# Patient Record
Sex: Female | Born: 1970 | Race: White | Hispanic: No | Marital: Married | Smoking: Never smoker
Health system: Southern US, Community
[De-identification: ages and names within clinical notes are randomized; demographics above are authoritative.]

## PROBLEM LIST (undated history)

## (undated) DIAGNOSIS — C50919 Malignant neoplasm of unspecified site of unspecified female breast: Secondary | ICD-10-CM

## (undated) DIAGNOSIS — B019 Varicella without complication: Secondary | ICD-10-CM

## (undated) DIAGNOSIS — I10 Essential (primary) hypertension: Secondary | ICD-10-CM

## (undated) DIAGNOSIS — O00109 Unspecified tubal pregnancy without intrauterine pregnancy: Secondary | ICD-10-CM

## (undated) HISTORY — DX: Unspecified tubal pregnancy without intrauterine pregnancy: O00.109

## (undated) HISTORY — DX: Essential (primary) hypertension: I10

## (undated) HISTORY — PX: ECTOPIC PREGNANCY SURGERY: SHX613

## (undated) HISTORY — DX: Varicella without complication: B01.9

## (undated) HISTORY — PX: TUBAL LIGATION: SHX77

---

## 2010-03-12 HISTORY — PX: BLADDER SUSPENSION: SHX72

## 2010-07-22 HISTORY — PX: LAPAROSCOPIC ASSISTED VAGINAL HYSTERECTOMY: SHX5398

## 2010-10-11 HISTORY — PX: MASTECTOMY: SHX3

## 2011-05-11 HISTORY — PX: BREAST ENHANCEMENT SURGERY: SHX7

## 2011-09-22 ENCOUNTER — Telehealth: Payer: Self-pay | Admitting: *Deleted

## 2011-09-22 NOTE — Telephone Encounter (Signed)
Left message for pt to return my call so I can schedule her.

## 2011-09-29 ENCOUNTER — Other Ambulatory Visit: Payer: Self-pay | Admitting: *Deleted

## 2011-09-29 DIAGNOSIS — Z853 Personal history of malignant neoplasm of breast: Secondary | ICD-10-CM

## 2011-10-05 ENCOUNTER — Encounter (HOSPITAL_COMMUNITY): Payer: Self-pay | Admitting: *Deleted

## 2011-10-05 ENCOUNTER — Emergency Department (HOSPITAL_COMMUNITY)
Admission: EM | Admit: 2011-10-05 | Discharge: 2011-10-05 | Disposition: A | Discharge status: Home / Self Care | Payer: BC Managed Care – PPO | Source: Home / Self Care | Attending: Emergency Medicine | Admitting: Emergency Medicine

## 2011-10-05 DIAGNOSIS — N72 Inflammatory disease of cervix uteri: Secondary | ICD-10-CM

## 2011-10-05 HISTORY — DX: Malignant neoplasm of unspecified site of unspecified female breast: C50.919

## 2011-10-05 LAB — POCT URINALYSIS DIP (DEVICE)
Ketones, ur: 40 mg/dL — AB
Protein, ur: 30 mg/dL — AB

## 2011-10-05 LAB — WET PREP, GENITAL
Clue Cells Wet Prep HPF POC: NONE SEEN
Trich, Wet Prep: NONE SEEN
Yeast Wet Prep HPF POC: NONE SEEN

## 2011-10-05 LAB — POCT PREGNANCY, URINE: Preg Test, Ur: NEGATIVE

## 2011-10-05 MED ORDER — AZITHROMYCIN 250 MG PO TABS
1000.0000 mg | ORAL_TABLET | Freq: Once | ORAL | Status: AC
  Administered 2011-10-05: 1000 mg via ORAL

## 2011-10-05 MED ORDER — AZITHROMYCIN 250 MG PO TABS
ORAL_TABLET | ORAL | Status: AC
  Filled 2011-10-05: qty 5

## 2011-10-05 MED ORDER — CEFIXIME 400 MG PO TABS
ORAL_TABLET | ORAL | Status: AC
  Filled 2011-10-05: qty 1

## 2011-10-05 MED ORDER — CEFIXIME 400 MG PO TABS
400.0000 mg | ORAL_TABLET | Freq: Every day | ORAL | Status: DC
  Administered 2011-10-05: 400 mg via ORAL

## 2011-10-05 NOTE — ED Provider Notes (Signed)
Medical screening examination/treatment/procedure(s) were performed by non-physician practitioner and as supervising physician I was immediately available for consultation/collaboration.  Leslee Home, M.D.   Reuben Likes, MD 10/05/11 2135

## 2011-10-05 NOTE — ED Provider Notes (Signed)
History     CSN: [id number]  Arrival date & time 10/05/11  1757   None     Chief Complaint  Patient presents with  . Vaginal Itching  . Sore Throat    (Consider location/radiation/quality/duration/timing/severity/associated sxs/prior treatment) Patient is a 41 y.o. female presenting with vaginal discharge. The history is provided by the patient. No language interpreter was used.  Vaginal Discharge This is a new problem. The problem occurs constantly. The problem has been gradually worsening. Pertinent negatives include no abdominal pain. Nothing aggravates the symptoms. Nothing relieves the symptoms. She has tried nothing for the symptoms.  Pt reports she may have an std risk.  Pt complains of a sore throat  Past Medical History  Diagnosis Date  . Breast cancer     Past Surgical History  Procedure Date  . Mastectomy   . Abdominal hysterectomy     Family History  Problem Relation Age of Onset  . Family history unknown: Yes    History  Substance Use Topics  . Smoking status: Never Smoker   . Smokeless tobacco: Not on file  . Alcohol Use: Yes     socially    OB History    Grav Para Term Preterm Abortions TAB SAB Ect Mult Living                  Review of Systems  Gastrointestinal: Negative for abdominal pain.  Genitourinary: Positive for vaginal discharge.  All other systems reviewed and are negative.    Allergies  Review of patient's allergies indicates no known allergies.  Home Medications  No current outpatient prescriptions on file.  BP 125/79  Pulse 64  Temp 98.4 F (36.9 C) (Oral)  Resp 18  SpO2 100%  Physical Exam  Nursing note and vitals reviewed. Constitutional: She is oriented to person, place, and time. She appears well-developed and well-nourished.  HENT:  Head: Normocephalic and atraumatic.  Right Ear: External ear normal.  Left Ear: External ear normal.       Erythematous throat,   Eyes: Conjunctivae and EOM are normal. Pupils  are equal, round, and reactive to light.  Neck: Normal range of motion. Neck supple.  Cardiovascular: Normal rate.   Pulmonary/Chest: Effort normal.  Abdominal: Soft.  Genitourinary: Vaginal discharge found.  Musculoskeletal: Normal range of motion.  Neurological: She is alert and oriented to person, place, and time. She has normal reflexes.  Skin: Skin is warm.    ED Course  Procedures (including critical care time)  Labs Reviewed  POCT URINALYSIS DIP (DEVICE) - Abnormal; Notable for the following:    Bilirubin Urine SMALL (*)     Ketones, ur 40 (*)     Hgb urine dipstick TRACE (*)     Protein, ur 30 (*)     Leukocytes, UA SMALL (*)  Biochemical Testing Only. Please order routine urinalysis from main lab if confirmatory testing is needed.   All other components within normal limits  POCT PREGNANCY, URINE   No results found.   1. Cervicitis       MDM  Pt request std treatment.  Pt given suprax and zithromax,         Lonia Skinner Page, [place] 10/05/11 2033

## 2011-10-05 NOTE — ED Notes (Signed)
Pt reports vaginal itching/irratation - yellow discharge. Also reports sore throat.

## 2011-10-06 LAB — GC/CHLAMYDIA PROBE AMP, GENITAL
Chlamydia, DNA Probe: NEGATIVE
GC Probe Amp, Genital: NEGATIVE

## 2011-10-13 ENCOUNTER — Encounter: Payer: Self-pay | Admitting: Oncology

## 2011-10-13 ENCOUNTER — Other Ambulatory Visit (HOSPITAL_BASED_OUTPATIENT_CLINIC_OR_DEPARTMENT_OTHER): Payer: BC Managed Care – PPO | Admitting: Lab

## 2011-10-13 ENCOUNTER — Ambulatory Visit: Payer: BC Managed Care – PPO

## 2011-10-13 ENCOUNTER — Ambulatory Visit (HOSPITAL_BASED_OUTPATIENT_CLINIC_OR_DEPARTMENT_OTHER): Payer: BC Managed Care – PPO | Admitting: Oncology

## 2011-10-13 VITALS — BP 140/95 | HR 82 | Temp 98.2°F | Resp 20 | Ht 65.2 in | Wt 179.6 lb

## 2011-10-13 DIAGNOSIS — Z853 Personal history of malignant neoplasm of breast: Secondary | ICD-10-CM

## 2011-10-13 DIAGNOSIS — C50919 Malignant neoplasm of unspecified site of unspecified female breast: Secondary | ICD-10-CM | POA: Insufficient documentation

## 2011-10-13 DIAGNOSIS — C50219 Malignant neoplasm of upper-inner quadrant of unspecified female breast: Secondary | ICD-10-CM

## 2011-10-13 LAB — CBC WITH DIFFERENTIAL/PLATELET
Basophils Absolute: 0.1 10*3/uL (ref 0.0–0.1)
EOS%: 1.7 % (ref 0.0–7.0)
LYMPH%: 30.7 % (ref 14.0–49.7)
MCH: 29.6 pg (ref 25.1–34.0)
MCV: 87.9 fL (ref 79.5–101.0)
MONO%: 6.7 % (ref 0.0–14.0)
Platelets: 258 10*3/uL (ref 145–400)
RBC: 4.62 10*6/uL (ref 3.70–5.45)
RDW: 13.8 % (ref 11.2–14.5)

## 2011-10-13 LAB — COMPREHENSIVE METABOLIC PANEL (CC13)
AST: 15 U/L (ref 5–34)
Albumin: 4 g/dL (ref 3.5–5.0)
Alkaline Phosphatase: 96 U/L (ref 40–150)
BUN: 13 mg/dL (ref 7.0–26.0)
Potassium: 3.9 mEq/L (ref 3.5–5.1)
Sodium: 143 mEq/L (ref 136–145)
Total Bilirubin: 0.4 mg/dL (ref 0.20–1.20)

## 2011-10-13 MED ORDER — TAMOXIFEN CITRATE 20 MG PO TABS: 20.0000 mg | ORAL_TABLET | Freq: Every day | ORAL | Status: AC

## 2011-10-13 NOTE — Progress Notes (Signed)
ID: Leslie Sullivan   DOB: [birth year]  MR#: [id number]  HYQ#:[id number]  PCP: No primary provider on file. GYN:  SU:  OTHER MD:   HISTORY OF PRESENT ILLNESS: The patient noted a mass in the right breast which appeared to change with her cycle. Mammography on 09/01/2010, however, at the [facility] in [place] showed a density in the medial aspect of the right breast, confirmed by additional views and ultrasonography, which showed a hypoechoic mass measuring 2.3 cm in the medial right breast. This was biopsied 09/12/2010, and showed (accession number [facility] [id number]) an invasive ductal carcinoma, grade 1, estrogen and progesterone receptor positive (both 3+), with an intermediate Ki-67, and an indeterminate HER-2 at 2+, further evaluated by [facility], which was negative.  The patient underwent bilateral breast MRI 09/19/2010. This confirmed the spiculated mass in the right breast upper inner aspect measuring 2.6 cm. Immediately anterior and medial to this was a small smooth nodule measuring 5 mm which was nonspecific. A third nodule in the lateral right breast measuring 1.7 mm was felt to be likely consistent with a fibroadenoma. In the left breast there were 2 foci of enhancement. 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 With this information and after appropriate discussion, the patient opted for bilateral mastectomies with right sentinel lymph node sampling. This was performed 10/24/2010, and showed ([id number]) on the right side, and invasive ductal carcinoma measuring 2.3 cm, grade 1, with one out of 9 lymph nodes sampled (2 of them being sentinel lymph nodes) positive. The left breast was benign. Her subsequent history is as detailed below.  INTERVAL HISTORY: French Ana moved to the [place] area recently and was seen in the breast clinic on  10/13/2011 to establish continuity of care.   REVIEW OF SYSTEMS: She tells me she did very well with her surgery and chemotherapy. A note from her oncologist an argon states that she had a radiation oncology consult in that the patient decided she did not want to do additional chemotherapy. The patient tells me that postmastectomy radiotherapy was not recommended. At any rate she was supposed to have started tamoxifen in February, but she "read up on it" and decided the side effects were not worth the decrease in risk (which she understood to be about 7%). Overall a detailed review of systems today was entirely negative.   PAST MEDICAL HISTORY: Past Medical History  Diagnosis Date  . Breast cancer   . Ectopic pregnancy, tubal     x2    PAST SURGICAL HISTORY: Past Surgical History  Procedure Date  . Mastectomy     bilateral  . Abdominal hysterectomy     no salpingo-oophorectomy    FAMILY HISTORY  (updated September 2013) No family history on file. The patient's parents are alive, both 41 years old. The patient has no brothers, 4 sisters. The patient's mother was diagnosed with cervical cancer at the age of 85. The patient's father was diagnosed with prostate cancer the age of 51. There is no history of breast or ovarian cancer in the immediate family.  GYNECOLOGIC HISTORY: Menarche age 41, first live birth age 41. She is GX P1. The patient underwent hysterectomy in may of 2012. There was no salpingo-oophorectomy. She never used hormone replacement. 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 The patient lives alone, with no pets.   ADVANCED DIRECTIVES: Not in place  HEALTH MAINTENANCE: History  Substance Use Topics  . Smoking status: Never Smoker   . Smokeless tobacco: Not on file  . Alcohol Use: Yes     socially      Colonoscopy:  PAP:  Bone density:  Lipid panel:  No Known Allergies  Current Outpatient Prescriptions  Medication Sig Dispense Refill  . MULTIPLE MINERALS PO Take by mouth. Pt takes zinc, fish oil, thyraxis,vitamin d and Immuncare      . MULTIPLE VITAMINS PO Take by mouth.      . tamoxifen (NOLVADEX) 20 MG tablet Take 1 tablet (20 mg total) by mouth daily.  90 tablet  12    OBJECTIVE: Young white woman who appears well Filed Vitals:   10/13/11 1613  BP: 140/95  Pulse: 82  Temp: 98.2 F (36.8 C)  Resp: 20     Body mass index is 29.70 kg/(m^2).    ECOG FS: 0  Sclerae unicteric Oropharynx clear No cervical or supraclavicular adenopathy Lungs no rales or rhonchi Heart regular rate and rhythm Abd benign MSK no focal spinal tenderness, no peripheral edema Neuro: nonfocal Breasts: Status post bilateral mastectomies with implant placement. There is no evidence of local recurrence on the right. Both axillae are clear.  LAB RESULTS: Lab Results  Component Value Date   WBC 4.9 10/13/2011      Chemistry      Component Value Date/Time   NA 143 10/13/2011 1550      Component Value Date/Time   CALCIUM 9.6 10/13/2011 1550       No results found for this basename: LABCA2    No components found with this basename: [id number]    No results found for this basename: INR:1;PROTIME:1 in the last 168 hours  Urinalysis    Component Value Date/Time   LABSPEC 1.025 10/05/2011 1934   PHURINE 6.0 10/05/2011 1934   GLUCOSEU NEGATIVE 10/05/2011 1934   HGBUR TRACE* 10/05/2011 1934   BILIRUBINUR SMALL* 10/05/2011 1934   KETONESUR 40* 10/05/2011 1934   PROTEINUR 30* 10/05/2011 1934   UROBILINOGEN 0.2 10/05/2011 1934   NITRITE NEGATIVE 10/05/2011 1934   LEUKOCYTESUR SMALL* 10/05/2011 1934    STUDIES: No results found. Outside reports reviewed.  ASSESSMENT: 41 y.o. BRCA negative [place] woman  (1) s/p bilateral mastectomies and Right sentinel lymph node sampling 10/24/2010 (with a  total of 9 axillary nodes removed) for a Right-sided T2 N1, stage IIB invasive ductal carcinoma, grade 1, strongly estrogen and progesterone receptor positive, HER-2 negative by FISH, with an intermediate Ki-67  (2) s/p cyclophosphamide/ docetaxel x6 completed January 2013  (3) no adjuvant radiation therapy  (4) supposed to be on tamoxifen as of February 2013, but has not yet started anti-estrogen therapy  PLAN: We went over her situation in detail. Initially I thought she had only had 6 lymph nodes removed, but reading over the pathology report with 9 lymph nodes removed total I think axillary exploration is adequate. We would have urged of postmastectomy radiation on the right here in [place], but this is an area that is still controversial. By contrast a recommendation to start tamoxifen is standard of care.  We went over her wrist situation in detail. The adjuvant! Database would quote her a 20% risk of dying from breast cancer within the next 10 years if her only adjuvant treatment is chemotherapy. This decreases to  about 14% he she takes anti-estrogens. Optimal antiestrogen therapy can cut residual risk in half, which means she would have a 7% decrease in mortality at 10 years if she took tamoxifen and aromatase inhibitors in some combination.  We then went over the possible toxicities, side effects and complications of tamoxifen. I gave her this information in writing. At this point she has not yet decided to start tamoxifen, but is considering it, and did request a prescription so she would not have to call in case she decides to take it. I have made her a return appointment in 3 months out to see how she is tolerating it. At that point we will decide whether to continue every 3, every 4, or every 6 month followup. She understands it would be helpful if she exercise 45 minutes 5 times a week. Otherwise she knows to call for any problems that may develop before the next  visit.   Kacyn Souder C    10/13/2011

## 2011-10-13 NOTE — Progress Notes (Signed)
Checked in new pt.  No financial concerns at this time. °

## 2011-10-14 ENCOUNTER — Telehealth: Payer: Self-pay | Admitting: *Deleted

## 2011-10-14 ENCOUNTER — Encounter: Payer: Self-pay | Admitting: *Deleted

## 2011-10-14 NOTE — Telephone Encounter (Signed)
Gave patient appointment for Leslie Sullivan on 10-29-2011 arrival time 8:45am left voice message to inform the patient of the appointment for lab and md

## 2011-10-14 NOTE — Progress Notes (Signed)
Mailed after appt letter to pt. 

## 2012-01-13 ENCOUNTER — Ambulatory Visit: Payer: BC Managed Care – PPO | Admitting: Oncology

## 2012-01-13 ENCOUNTER — Other Ambulatory Visit: Payer: BC Managed Care – PPO | Admitting: Lab

## 2012-02-09 ENCOUNTER — Telehealth: Payer: Self-pay | Admitting: *Deleted

## 2012-02-09 NOTE — Telephone Encounter (Signed)
per dawn rescheduled patient to 02-23-2012 left voice message to inform the patient of the new date and time

## 2012-02-16 ENCOUNTER — Ambulatory Visit: Payer: BC Managed Care – PPO | Admitting: Oncology

## 2012-02-16 ENCOUNTER — Other Ambulatory Visit: Payer: BC Managed Care – PPO | Admitting: Lab

## 2012-02-22 ENCOUNTER — Other Ambulatory Visit: Payer: Self-pay | Admitting: *Deleted

## 2012-02-23 ENCOUNTER — Ambulatory Visit: Payer: BC Managed Care – PPO | Admitting: Oncology

## 2012-02-23 ENCOUNTER — Other Ambulatory Visit: Payer: BC Managed Care – PPO | Admitting: Lab

## 2012-02-24 ENCOUNTER — Telehealth: Payer: Self-pay | Admitting: Oncology

## 2012-02-24 NOTE — Telephone Encounter (Signed)
Letter sent to patient from Dr. Magrinat. °

## 2012-05-30 ENCOUNTER — Other Ambulatory Visit: Payer: Self-pay | Admitting: *Deleted

## 2012-05-30 ENCOUNTER — Telehealth: Payer: Self-pay | Admitting: Oncology

## 2012-05-30 NOTE — Telephone Encounter (Signed)
S/w pt re appt for 4/24.

## 2012-06-02 ENCOUNTER — Other Ambulatory Visit: Payer: Self-pay | Admitting: *Deleted

## 2012-06-02 ENCOUNTER — Ambulatory Visit (HOSPITAL_BASED_OUTPATIENT_CLINIC_OR_DEPARTMENT_OTHER): Payer: BC Managed Care – PPO | Admitting: Oncology

## 2012-06-02 ENCOUNTER — Other Ambulatory Visit (HOSPITAL_BASED_OUTPATIENT_CLINIC_OR_DEPARTMENT_OTHER): Payer: BC Managed Care – PPO | Admitting: Lab

## 2012-06-02 ENCOUNTER — Telehealth: Payer: Self-pay | Admitting: *Deleted

## 2012-06-02 VITALS — BP 127/82 | HR 66 | Temp 98.5°F | Resp 20 | Ht 65.0 in | Wt 204.9 lb

## 2012-06-02 DIAGNOSIS — Z853 Personal history of malignant neoplasm of breast: Secondary | ICD-10-CM

## 2012-06-02 DIAGNOSIS — Z170 Estrogen receptor positive status [ER+]: Secondary | ICD-10-CM

## 2012-06-02 DIAGNOSIS — C50219 Malignant neoplasm of upper-inner quadrant of unspecified female breast: Secondary | ICD-10-CM

## 2012-06-02 DIAGNOSIS — C50911 Malignant neoplasm of unspecified site of right female breast: Secondary | ICD-10-CM

## 2012-06-02 LAB — COMPREHENSIVE METABOLIC PANEL (CC13)
AST: 15 U/L (ref 5–34)
Albumin: 3.9 g/dL (ref 3.5–5.0)
Alkaline Phosphatase: 73 U/L (ref 40–150)
BUN: 13.2 mg/dL (ref 7.0–26.0)
Creatinine: 0.8 mg/dL (ref 0.6–1.1)
Glucose: 101 mg/dl — ABNORMAL HIGH (ref 70–99)
Potassium: 4.7 mEq/L (ref 3.5–5.1)
Total Bilirubin: 0.44 mg/dL (ref 0.20–1.20)

## 2012-06-02 LAB — CBC WITH DIFFERENTIAL/PLATELET
Basophils Absolute: 0.1 10*3/uL (ref 0.0–0.1)
EOS%: 2.1 % (ref 0.0–7.0)
Eosinophils Absolute: 0.1 10*3/uL (ref 0.0–0.5)
HGB: 14.2 g/dL (ref 11.6–15.9)
LYMPH%: 26.5 % (ref 14.0–49.7)
MCH: 29.3 pg (ref 25.1–34.0)
MCV: 88.7 fL (ref 79.5–101.0)
MONO%: 6.5 % (ref 0.0–14.0)
NEUT#: 3.4 10*3/uL (ref 1.5–6.5)
NEUT%: 63.9 % (ref 38.4–76.8)
Platelets: 210 10*3/uL (ref 145–400)
RDW: 13.2 % (ref 11.2–14.5)

## 2012-06-02 NOTE — Progress Notes (Signed)
ID: Leslie Sullivan   DOB: 1970-09-14  MR#: [id number]  WUX#:[id number]  PCP: Pcp Not In System GYN:  SU:  OTHER MD:   HISTORY OF PRESENT ILLNESS: The patient noted a mass in the right breast which appeared to change with her cycle. Mammography on 09/01/2010, however, at the [facility] in [place] showed a density in the medial aspect of the right breast, confirmed by additional views and ultrasonography, which showed a hypoechoic mass measuring 2.3 cm in the medial right breast. This was biopsied 09/12/2010, and showed (accession number [facility] [id number]) an invasive ductal carcinoma, grade 1, estrogen and progesterone receptor positive (both 3+), with an intermediate Ki-67, and an indeterminate HER-2 at 2+, further evaluated by [facility], which was negative.  The patient underwent bilateral breast MRI 09/19/2010. This confirmed the spiculated mass in the right breast upper inner aspect measuring 2.6 cm. Immediately anterior and medial to this was a small smooth nodule measuring 5 mm which was nonspecific. A third nodule in the lateral right breast measuring 1.7 mm was felt to be likely consistent with a fibroadenoma. In the left breast there were 2 foci of enhancement. 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 With this information and after appropriate discussion, the patient opted for bilateral mastectomies with right sentinel lymph node sampling. This was performed 10/24/2010, and showed ([id number]) on the right side, and invasive ductal carcinoma measuring 2.3 cm, grade 1, with one out of 9 lymph nodes sampled (2 of them being sentinel lymph nodes) positive. The left breast was benign. Her subsequent history is as detailed below.  INTERVAL HISTORY: Leslie Sullivan returns today for followup of her breast cancer. She continues to think about antiestrogen  therapy but has fairly much decided not to take tamoxifen or similar drugs.   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 As a result she has some nausea and sometimes even vomiting a couple of times a month. She is not exercising regularly because she is so busy. She is having some loose bowel movements, but no black or tarry bowel movements and no blood in her bowel movements. She has always had irregular bowel movements, but these are little bit looser than usual for her. She describes herself is moderately fatigued and is waking up at therefore in the morning his thinking about work. She can have palpitations at the same time. She is having more hot flashes. Otherwise a detailed review of systems today was stable  PAST MEDICAL HISTORY: Past Medical History  Diagnosis Date  . Breast cancer   . Ectopic pregnancy, tubal     x2    PAST SURGICAL HISTORY: Past Surgical History  Procedure Laterality Date  . Mastectomy      bilateral  . Abdominal hysterectomy      no salpingo-oophorectomy    FAMILY HISTORY  (updated September 2013) No family history on file. The patient's parents are alive, both 27 years old. The patient has no brothers, 4 sisters. The patient's mother was diagnosed with cervical cancer at the age of 67. The patient's father was diagnosed with prostate cancer the age of 96. There is no history of breast or ovarian cancer in the immediate family.  GYNECOLOGIC HISTORY: Menarche age 48, first live birth age  22. She is GX P1. The patient underwent hysterectomy in may of 2012. There was no salpingo-oophorectomy. She never used hormone replacement. 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 The patient lives alone, with no  pets.   ADVANCED DIRECTIVES: Not in place  HEALTH MAINTENANCE: History  Substance Use Topics  . Smoking status: Never Smoker   . Smokeless tobacco: Not on file  . Alcohol Use: Yes     Comment: socially     Colonoscopy:  PAP:  Bone density:  Lipid panel:  No Known Allergies  Current Outpatient Prescriptions  Medication Sig Dispense Refill  . MULTIPLE MINERALS PO Take by mouth. Pt takes zinc, fish oil, thyraxis,vitamin d and Immuncare      . MULTIPLE VITAMINS PO Take by mouth.       No current facility-administered medications for this visit.    OBJECTIVE: Young white woman who appears well Filed Vitals:   06/02/12 1201  BP: 127/82  Pulse: 66  Temp: 98.5 F (36.9 C)  Resp: 20     Body mass index is 34.1 kg/(m^2).    ECOG FS: 0  Sclerae unicteric Oropharynx clear No cervical or supraclavicular adenopathy; mild thyromegaly Lungs no rales or rhonchi Heart regular rate and rhythm Abd benign MSK no focal spinal tenderness, no peripheral edema Neuro: nonfocal Breasts: Status post bilateral mastectomies with implant placement. Careful palpation of the right axilla, where she felt there might be something hard, shows nothing sips suspicious for recurrence. What I am feeling is muscle, rib, and the edge of the implant. Both axillae are clear.  LAB RESULTS: Lab Results  Component Value Date   WBC 5.3 06/02/2012      Chemistry      Component Value Date/Time   NA 143 10/13/2011 1550      Component Value Date/Time   CALCIUM 9.6 10/13/2011 1550       Lab Results  Component Value Date   LABCA2 9 10/13/2011    No components found with this basename: [id number]    No results found for this basename: INR,  in the last 168 hours  Urinalysis    Component Value Date/Time   LABSPEC 1.025 10/05/2011 1934   PHURINE 6.0 10/05/2011 1934   GLUCOSEU NEGATIVE 10/05/2011 1934   HGBUR TRACE* 10/05/2011 1934   BILIRUBINUR SMALL* 10/05/2011 1934   KETONESUR 40* 10/05/2011 1934    PROTEINUR 30* 10/05/2011 1934   UROBILINOGEN 0.2 10/05/2011 1934   NITRITE NEGATIVE 10/05/2011 1934   LEUKOCYTESUR SMALL* 10/05/2011 1934    STUDIES: No results found.   ASSESSMENT: 42 y.o. BRCA negative [place] woman  (1) s/p bilateral mastectomies and Right sentinel lymph node sampling 10/24/2010 (with a total of 9 axillary nodes removed) for a Right-sided T2 N1, stage IIB invasive ductal carcinoma, grade 1, strongly estrogen and progesterone receptor positive, HER-2 negative by FISH, with an intermediate Ki-67  (2) s/p cyclophosphamide/ docetaxel x6 completed January 2013  (3) no adjuvant radiation therapy  (4)  The adjuvant! database would quote her a 20% risk of dying from breast cancer within the next 10 years with local treatment (surgery) only. Chemotherapy decreases that risk to about 14%. Optimal antiestrogen therapy can cut residual risk in half, which means she would have a 7% decrease in mortality at 10 years if she took tamoxifen and aromatase inhibitors in some combination.  (  5) has decided against anti-estrogen therapy  PLAN: I think the right excellent is okay but we're going to obtain an ultrasound of that area just to make sure is double sure, since she has felt a change. I am also setting her up for ultrasound of the neck, to evaluate her thyroid, but we will not do that until October. We will obtain FSH, LH and estradiol also before the next visit.  I gave her a copy of the adjuvant prognostic data and also information in writing on tamoxifen and anastrozole. I don't want to continue to badger her by going over the same risk profile every time she comes in, but I do want her to have this data and now she has it hand and may review it as she sees fit. She has a good understanding that our recommendation is for her to take anti-estrogens for 5 years.  I have encouraged her to exercise on a regular basis as the best way to handle the work stress. She will return to see [place]  in 6 months she knows to call for any problems that may develop before that visit. Sukaina Toothaker C    06/02/2012

## 2012-06-02 NOTE — Telephone Encounter (Signed)
gv appt for 10.24.14, and for [facility]. Informed pt that cs will call to schedule her appt for her ultrasound...td

## 2012-11-01 ENCOUNTER — Telehealth: Payer: Self-pay | Admitting: *Deleted

## 2012-11-01 NOTE — Telephone Encounter (Signed)
LM informed the pt that i was returning her call....td

## 2012-11-16 ENCOUNTER — Ambulatory Visit (HOSPITAL_COMMUNITY): Payer: BC Managed Care – PPO

## 2012-11-23 ENCOUNTER — Telehealth: Payer: Self-pay | Admitting: Oncology

## 2012-11-23 NOTE — Telephone Encounter (Signed)
Pt called to cx October appts due to she no longer lives in [place].

## 2012-11-25 ENCOUNTER — Other Ambulatory Visit: Payer: BC Managed Care – PPO | Admitting: Lab

## 2012-12-02 ENCOUNTER — Ambulatory Visit: Payer: BC Managed Care – PPO | Admitting: Family

## 2012-12-02 ENCOUNTER — Other Ambulatory Visit: Payer: BC Managed Care – PPO | Admitting: Lab

## 2013-11-27 ENCOUNTER — Telehealth: Payer: Self-pay | Admitting: *Deleted

## 2013-11-27 NOTE — Telephone Encounter (Signed)
Former patient of Dr. Jana Hakim that moved out of area. Has moved back and would like follow-up. In basket sent to [facility].

## 2013-11-28 ENCOUNTER — Telehealth: Payer: Self-pay | Admitting: Oncology

## 2013-11-28 NOTE — Telephone Encounter (Signed)
per pof to sch pt appt-cld pt left a message-mailed copy of sch

## 2013-12-25 ENCOUNTER — Telehealth: Payer: Self-pay | Admitting: Oncology

## 2013-12-25 NOTE — Telephone Encounter (Signed)
per pof to sch pt appt-[facility] sch not open for May-adv will call-adv [facility] will call to sch-Kim to r/s appt-

## 2014-01-15 ENCOUNTER — Telehealth: Payer: Self-pay | Admitting: Oncology

## 2014-01-15 NOTE — Telephone Encounter (Signed)
pt cld left vm wanting to chge appt-cld pt left messaage to call back to r/s-adv [facility] booked upt o Feb-adv to call back to r/s a time & date

## 2014-01-31 ENCOUNTER — Other Ambulatory Visit: Payer: BC Managed Care – PPO

## 2014-01-31 ENCOUNTER — Ambulatory Visit: Payer: BC Managed Care – PPO | Admitting: Oncology

## 2014-02-13 ENCOUNTER — Other Ambulatory Visit: Payer: Self-pay | Admitting: *Deleted

## 2014-02-13 DIAGNOSIS — C50919 Malignant neoplasm of unspecified site of unspecified female breast: Secondary | ICD-10-CM

## 2014-02-14 ENCOUNTER — Other Ambulatory Visit (HOSPITAL_BASED_OUTPATIENT_CLINIC_OR_DEPARTMENT_OTHER): Payer: Managed Care, Other (non HMO)

## 2014-02-14 ENCOUNTER — Telehealth: Payer: Self-pay | Admitting: Oncology

## 2014-02-14 ENCOUNTER — Other Ambulatory Visit: Payer: Self-pay | Admitting: *Deleted

## 2014-02-14 ENCOUNTER — Ambulatory Visit (HOSPITAL_BASED_OUTPATIENT_CLINIC_OR_DEPARTMENT_OTHER): Payer: Managed Care, Other (non HMO) | Admitting: Oncology

## 2014-02-14 VITALS — BP 180/104 | HR 73 | Temp 98.4°F | Resp 18 | Ht 65.0 in | Wt 214.5 lb

## 2014-02-14 DIAGNOSIS — C50911 Malignant neoplasm of unspecified site of right female breast: Secondary | ICD-10-CM

## 2014-02-14 DIAGNOSIS — Z853 Personal history of malignant neoplasm of breast: Secondary | ICD-10-CM

## 2014-02-14 DIAGNOSIS — C50919 Malignant neoplasm of unspecified site of unspecified female breast: Secondary | ICD-10-CM

## 2014-02-14 DIAGNOSIS — Z79818 Long term (current) use of other agents affecting estrogen receptors and estrogen levels: Secondary | ICD-10-CM

## 2014-02-14 LAB — CBC WITH DIFFERENTIAL/PLATELET
BASO%: 0.5 % (ref 0.0–2.0)
BASOS ABS: 0 10*3/uL (ref 0.0–0.1)
EOS ABS: 0.1 10*3/uL (ref 0.0–0.5)
EOS%: 1.5 % (ref 0.0–7.0)
HCT: 43.1 % (ref 34.8–46.6)
HEMOGLOBIN: 14.2 g/dL (ref 11.6–15.9)
LYMPH%: 19.1 % (ref 14.0–49.7)
MCH: 29.3 pg (ref 25.1–34.0)
MCHC: 32.9 g/dL (ref 31.5–36.0)
MCV: 88.9 fL (ref 79.5–101.0)
MONO#: 0.4 10*3/uL (ref 0.1–0.9)
MONO%: 6.3 % (ref 0.0–14.0)
NEUT%: 72.6 % (ref 38.4–76.8)
NEUTROS ABS: 4.4 10*3/uL (ref 1.5–6.5)
Platelets: 254 10*3/uL (ref 145–400)
RBC: 4.85 10*6/uL (ref 3.70–5.45)
RDW: 13.4 % (ref 11.2–14.5)
WBC: 6 10*3/uL (ref 3.9–10.3)
lymph#: 1.2 10*3/uL (ref 0.9–3.3)

## 2014-02-14 LAB — COMPREHENSIVE METABOLIC PANEL (CC13)
ALBUMIN: 4.1 g/dL (ref 3.5–5.0)
ALK PHOS: 79 U/L (ref 40–150)
ALT: 21 U/L (ref 0–55)
ANION GAP: 8 meq/L (ref 3–11)
AST: 18 U/L (ref 5–34)
BILIRUBIN TOTAL: 0.45 mg/dL (ref 0.20–1.20)
BUN: 7.2 mg/dL (ref 7.0–26.0)
CO2: 28 meq/L (ref 22–29)
Calcium: 9.5 mg/dL (ref 8.4–10.4)
Chloride: 103 mEq/L (ref 98–109)
Creatinine: 0.8 mg/dL (ref 0.6–1.1)
EGFR: 88 mL/min/{1.73_m2} — AB (ref >90)
GLUCOSE: 114 mg/dL (ref 70–140)
Potassium: 4 mEq/L (ref 3.5–5.1)
Sodium: 139 mEq/L (ref 136–145)
Total Protein: 7.3 g/dL (ref 6.4–8.3)

## 2014-02-14 MED ORDER — TAMOXIFEN CITRATE 20 MG PO TABS: 20.0000 mg | ORAL_TABLET | Freq: Every day | ORAL | Status: DC

## 2014-02-14 NOTE — Telephone Encounter (Signed)
, °

## 2014-02-14 NOTE — Progress Notes (Signed)
ID: Leslie Sullivan   DOB: 23-Mar-1970  MR#: [id number]  YPP#:[id number]  PCP: Pcp Not In System GYN:  SU:  OTHER MD:  CHIEF COMPLAINT: Estrogen receptor positive breast cancer  CURRENT TREATMENT: Tamoxifen   HISTORY OF PRESENT ILLNESS: From the original intake note:  The patient noted a mass in the right breast which appeared to change with her cycle. Mammography on 09/01/2010, however, at the [facility] in [place] showed a density in the medial aspect of the right breast, confirmed by additional views and ultrasonography, which showed a hypoechoic mass measuring 2.3 cm in the medial right breast. This was biopsied 09/12/2010, and showed (accession number [facility] [id number]) an invasive ductal carcinoma, grade 1, estrogen and progesterone receptor positive (both 3+), with an intermediate Ki-67, and an indeterminate HER-2 at 2+, further evaluated by [facility], which was negative.  The patient underwent bilateral breast MRI 09/19/2010. This confirmed the spiculated mass in the right breast upper inner aspect measuring 2.6 cm. Immediately anterior and medial to this was a small smooth nodule measuring 5 mm which was nonspecific. A third nodule in the lateral right breast measuring 1.7 mm was felt to be likely consistent with a fibroadenoma. In the left breast there were 2 foci of enhancement. 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 With this information and after appropriate discussion, the patient opted for bilateral mastectomies with right sentinel lymph node sampling. This was performed 10/24/2010, and showed ([id number]) on the right side, and invasive ductal carcinoma measuring 2.3 cm, grade 1, with one out of 9 lymph nodes sampled (2 of them being sentinel lymph nodes) positive. The left breast was benign.   Her subsequent history is as  detailed below.  INTERVAL HISTORY: Leslie Sullivan returns today for followup of her breast cancer. Since her last visit here, approximately year and a half ago, she changed jobs, moved to [place], and got married. They separated in December 2015 (her husband is now living in [place]) and she is backing 10. She is here to reestablish care  REVIEW OF SYSTEMS: Leslie Sullivan continues to experience right a bit of stress at work although she likes her job. It's the newness of that. Of course moving and separating are stressful in themselves. In addition she is concerned of course about her breast cancer. She sleeps poorly. She is gained more weight. She is not exercising at all. She has an upset stomach and occasionally loose bowel movements. She denies unusual headaches, visual changes, nausea, vomiting, dizziness, or gait imbalance. There has not been any unusual cough, shortness of breath, or pleurisy. She denies pain, fever, rash, or bleeding problems. A detailed review of systems today was otherwise stable  PAST MEDICAL HISTORY: Past Medical History  Diagnosis Date  . Breast cancer   . Ectopic pregnancy, tubal     x2    PAST SURGICAL HISTORY: Past Surgical History  Procedure Laterality Date  . Mastectomy      bilateral  . Abdominal hysterectomy      no salpingo-oophorectomy    FAMILY HISTORY  (updated September 2013) No family history on file. The patient's parents are alive, in their 35s. The patient has no brothers, 4 sisters. The patient's mother was diagnosed with cervical cancer at the age of 62. The patient's father was diagnosed with prostate cancer the age of 63. There is no history of breast or  ovarian cancer in the immediate family.  GYNECOLOGIC HISTORY: Menarche age 32, first live birth age 49. She is GX P1. The patient underwent hysterectomy in May of 2012. There was no salpingo-oophorectomy. She never used hormone replacement. 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 Her son, Leslie Sullivan,  studies at the [employer]. The patient lives alone, with no pets.   ADVANCED DIRECTIVES: Not in place  HEALTH MAINTENANCE: History  Substance Use Topics  . Smoking status: Never Smoker   . Smokeless tobacco: Not on file  . Alcohol Use: Yes     Comment: socially     Colonoscopy:  PAP:  Bone density:  Lipid panel:  No Known Allergies  Current Outpatient Prescriptions  Medication Sig Dispense Refill  . MULTIPLE MINERALS PO Take by mouth. Pt takes zinc, fish oil, thyraxis,vitamin d and Immuncare    . MULTIPLE VITAMINS PO Take by mouth.     No current facility-administered medications for this visit.    OBJECTIVE: [name] in no acute distress Filed Vitals:   02/14/14 1107  BP: 180/104  Pulse:   Temp:   Resp:      Body mass index is 35.69 kg/(m^2).    ECOG FS: 0  Sclerae unicteric, pupils equal and reactive Oropharynx clear and moist-- no thrush No cervical or supraclavicular adenopathy Lungs no rales or rhonchi Heart regular rate and rhythm Abd soft, nontender, positive bowel sounds MSK no focal spinal tenderness, no upper extremity lymphedema Neuro: nonfocal, well oriented, appropriate affect Breasts: Status post bilateral mastectomies with implant reconstruction. The cosmetic result is good. There are no suspicious findings on either side and both axillae are benign.   LAB RESULTS: Basic Metabolic Panel:  Recent Labs Lab 02/14/14 1041  NA 139  K 4.0  CO2 28  GLUCOSE 114  BUN 7.2  CREATININE 0.8  CALCIUM 9.5   GFR Estimated Creatinine Clearance: 104.6 mL/min (by C-G formula based on Cr of 0.8). Liver Function Tests:  Recent Labs Lab 02/14/14 1041  AST 18  ALT 21  ALKPHOS 79  BILITOT 0.45  PROT 7.3  ALBUMIN 4.1   No results for input(s): LIPASE, AMYLASE in the last 168 hours. No results for input(s): AMMONIA in the last 168 hours. Coagulation  profile No results for input(s): INR, PROTIME in the last 168 hours.  CBC:  Recent Labs Lab 02/14/14 1041  WBC 6.0  NEUTROABS 4.4  HGB 14.2  HCT 43.1  MCV 88.9  PLT 254   Cardiac Enzymes: No results for input(s): CKTOTAL, CKMB, CKMBINDEX, TROPONINI in the last 168 hours. BNP: Invalid input(s): POCBNP CBG: No results for input(s): GLUCAP in the last 168 hours. D-Dimer No results for input(s): DDIMER in the last 72 hours. Hgb A1c No results for input(s): HGBA1C in the last 72 hours. Lipid Profile No results for input(s): CHOL, HDL, LDLCALC, TRIG, CHOLHDL, LDLDIRECT in the last 72 hours. Thyroid function studies No results for input(s): TSH, T4TOTAL, T3FREE, THYROIDAB in the last 72 hours.  Invalid input(s): FREET3 Anemia work up No results for input(s): VITAMINB12, FOLATE, FERRITIN, TIBC, IRON, RETICCTPCT in the last 72 hours. Sepsis Labs Invalid input(s): PROCALCITONIN,  WBC,  LACTICIDVEN Microbiology No results found for this or any previous visit (from the past 240 hour(s)).   No components found for: LABCA125  No results for input(s): INR in the last 168 hours.  Urinalysis    Component Value  Date/Time   LABSPEC 1.025 10/05/2011 1934   PHURINE 6.0 10/05/2011 1934   GLUCOSEU NEGATIVE 10/05/2011 1934   HGBUR TRACE* 10/05/2011 1934   BILIRUBINUR SMALL* 10/05/2011 1934   KETONESUR 40* 10/05/2011 1934   PROTEINUR 30* 10/05/2011 1934   UROBILINOGEN 0.2 10/05/2011 1934   NITRITE NEGATIVE 10/05/2011 1934   LEUKOCYTESUR SMALL* 10/05/2011 1934    STUDIES: No results found.   ASSESSMENT: 44 y.o. BRCA negative Leslie Sullivan  (1) s/p bilateral mastectomies and Right sentinel lymph node sampling 10/24/2010 (with a total of 9 axillary nodes removed) for a Right-sided T2 N1, stage IIB invasive ductal carcinoma, grade 1, strongly estrogen and progesterone receptor positive, HER-2 negative by FISH, with an intermediate Ki-67  (2) s/p cyclophosphamide/ docetaxel x6  completed January 2013  (3) no adjuvant radiation therapy  (4)  The adjuvant! database would quote her a 20% risk of dying from breast cancer within the next 10 years with local treatment (surgery) only. Chemotherapy decreases that risk to about 14%. Optimal antiestrogen therapy can cut residual risk in half, which means she would have a 7% decrease in mortality at 10 years if she took tamoxifen and aromatase inhibitors in some combination.  (5) decided against anti-estrogen therapy  PLAN: I met with Leslie Sullivan for approximately 40 minutes today. I strongly urged her to consider anti-estrogens. I gave her information on on, and menopausal symptoms, how these are affected by tamoxifen, and other side effects of tamoxifen that are possible. There are of course all other benefits of tamoxifen including improvement in bone density and to a certain extent the cholesterol level.  I think she is fairly terrified that her cancer will come back but she still very undecided regarding the tamoxifen. I went ahead and placed the prescription. I also wrote her for mammography even though she has bilateral implants. I think she will be somewhat reassured by that test and we can use it as a baseline in the future. I discouraged her from obtaining a PET scan in the absence of specific symptoms to evaluate. Otherwise I plan to simply follow her by physical exam and lab work.  The lab work will include an [facility] and estradiol level. I don't think she is yet completely menopausal, but of course it is hard to know since she is status post hysterectomy. If she has a high FSH and low estradiol can consider anastrozole instead of tamoxifen assuming she does not tolerate tamoxifen or doesn't want to start it.  I recommended 45 minutes of exercise 5 times a week. If she wishes to lose weight she will also be on a diet that minimizes calories, chiefly by cutting down on carbs.  She will see me again in May, to assess her tolerance of  tamoxifen assuming she does started. In any case we will resume routine follow-up after that visit.       Leslie Sullivan C    02/14/2014

## 2014-02-16 ENCOUNTER — Ambulatory Visit (HOSPITAL_BASED_OUTPATIENT_CLINIC_OR_DEPARTMENT_OTHER): Payer: Managed Care, Other (non HMO) | Admitting: Nurse Practitioner

## 2014-02-16 ENCOUNTER — Other Ambulatory Visit: Payer: Self-pay | Admitting: *Deleted

## 2014-02-16 ENCOUNTER — Telehealth: Payer: Self-pay | Admitting: *Deleted

## 2014-02-16 ENCOUNTER — Telehealth: Payer: Self-pay | Admitting: Nurse Practitioner

## 2014-02-16 ENCOUNTER — Encounter: Payer: Self-pay | Admitting: Nurse Practitioner

## 2014-02-16 ENCOUNTER — Other Ambulatory Visit: Payer: Self-pay | Admitting: Oncology

## 2014-02-16 VITALS — BP 163/108 | HR 74 | Temp 98.1°F | Resp 18 | Ht 65.0 in | Wt 217.8 lb

## 2014-02-16 DIAGNOSIS — I10 Essential (primary) hypertension: Secondary | ICD-10-CM

## 2014-02-16 DIAGNOSIS — Z853 Personal history of malignant neoplasm of breast: Secondary | ICD-10-CM

## 2014-02-16 DIAGNOSIS — C50911 Malignant neoplasm of unspecified site of right female breast: Secondary | ICD-10-CM

## 2014-02-16 DIAGNOSIS — L03313 Cellulitis of chest wall: Secondary | ICD-10-CM

## 2014-02-16 DIAGNOSIS — N63 Unspecified lump in unspecified breast: Secondary | ICD-10-CM

## 2014-02-16 DIAGNOSIS — L0390 Cellulitis, unspecified: Secondary | ICD-10-CM | POA: Insufficient documentation

## 2014-02-16 MED ORDER — CLONIDINE HCL 0.2 MG PO TABS
0.2000 mg | ORAL_TABLET | Freq: Once | ORAL | Status: AC
  Administered 2014-02-16: 0.2 mg via ORAL
  Filled 2014-02-16: qty 1

## 2014-02-16 MED ORDER — CLONIDINE HCL 0.1 MG PO TABS
ORAL_TABLET | ORAL | Status: AC
  Filled 2014-02-16: qty 2

## 2014-02-16 MED ORDER — HYDROCHLOROTHIAZIDE 25 MG PO TABS: 25.0000 mg | ORAL_TABLET | Freq: Every day | ORAL | Status: DC

## 2014-02-16 MED ORDER — CEPHALEXIN 500 MG PO CAPS: 500.0000 mg | ORAL_CAPSULE | Freq: Four times a day (QID) | ORAL | Status: DC

## 2014-02-16 NOTE — Assessment & Plan Note (Signed)
Blood pressure on initial check your the cancer Center was 184/125.  Patient typically runs a fairly low blood pressure.  She does not take any blood pressure medication whatsoever.  Patient was given clonidine 0.2 mg orally while at the cancer center.  Approximately 35-40 minutes later blood pressure had decreased to 163/108.  Patient does admit to added stress in her life at this time; and relates that this may very well be the reason for hypertension.  Patient will also be prescribed hydrochlorothiazide 25 mg to take on a daily basis in the morning.  Advised patient to start the HCTZ tomorrow morning 02/17/2014.  Also advised patient to check her blood pressure frequently over the weekend and to document the blood pressure readings.  Ice patient would call first thing on Monday morning 02/19/2014 to review blood pressure readings and make decision regarding further hypertension medication.  Patient confirmed that she does not have a primary care provider to manage her blood pressure medication.  Also advised patient to go directly to the emergency department over the weekend if she develops any hypertensive symptoms whatsoever.

## 2014-02-16 NOTE — Telephone Encounter (Signed)
per pof to add pt to [place] today-per pof pt aware

## 2014-02-16 NOTE — Progress Notes (Signed)
will   SYMPTOM MANAGEMENT CLINIC   HPI: Leslie Sullivan 44 y.o. female diagnosed with breast cancer.  Patient is status post bilateral mastectomies with reconstruction; and chemotherapy completed in January 2013.  Patient was just this past week given a prescription for tamoxifen to initiate as well.  Patient called the cancer Center today requesting urgent care visit.  She has recently noted that the shape of her right breast has changed and dropped.  She is concerned that the right breast implant may have ruptured.  She has noted an area directly below the right breast surgical site with erythema, edema, but no tenderness.  Also noted was patient's blood pressure on initial check at 184/125.  Patient states she typically runs a fairly low blood pressure.  She does admit to having additional stress in her life at this present time.   HPI  ROS  Past Medical History  Diagnosis Date  . Breast cancer   . Ectopic pregnancy, tubal     x2    Past Surgical History  Procedure Laterality Date  . Mastectomy      bilateral  . Abdominal hysterectomy      no salpingo-oophorectomy    has History of breast cancer; Breast cancer, right breast; Hypertension; and Cellulitis on her problem list.     has No Known Allergies.    Medication List       This list is accurate as of: 02/16/14  6:09 PM.  Always use your most recent med list.               cephALEXin 500 MG capsule  Commonly known as:  KEFLEX  Take 1 capsule (500 mg total) by mouth 4 (four) times daily.     hydrochlorothiazide 25 MG tablet  Commonly known as:  HYDRODIURIL  Take 1 tablet (25 mg total) by mouth daily.     MULTIPLE MINERALS PO  Take by mouth. Pt takes zinc, fish oil, thyraxis,vitamin d and Immuncare     MULTIPLE VITAMINS PO  Take by mouth.     tamoxifen 20 MG tablet  Commonly known as:  NOLVADEX  Take 1 tablet (20 mg total) by mouth daily.         PHYSICAL EXAMINATION  Blood pressure 163/108, pulse  74, temperature 98.1 F (36.7 C), temperature source Oral, resp. rate 18, height 5' 5"  (1.651 m), weight 217 lb 12.8 oz (98.793 kg), SpO2 100 %.  Physical Exam  Constitutional: She is oriented to person, place, and time and well-developed, well-nourished, and in no distress.  HENT:  Head: Normocephalic and atraumatic.  Eyes: Conjunctivae and EOM are normal. Pupils are equal, round, and reactive to light.  Neck: Normal range of motion.  Pulmonary/Chest: Effort normal. No respiratory distress.  Musculoskeletal: Normal range of motion.  Neurological: She is alert and oriented to person, place, and time. Gait normal.  Skin: Skin is warm and dry. There is erythema.  Area directly below right breast surgical site with an approximately 5 cm in length cellulitic area with erythema, edema, and mild warmth.  Area slightly firm.  There is no tenderness on exam.  There are no red streaks.  Right breast does appear slightly misshapen and much lower than the left breast.  Psychiatric: Affect normal.  Nursing note and vitals reviewed.   LABORATORY DATA:. Appointment on 02/14/2014  Component Date Value Ref Range Status  . WBC 02/14/2014 6.0  3.9 - 10.3 10e3/uL Final  . NEUT# 02/14/2014 4.4  1.5 - 6.5  10e3/uL Final  . HGB 02/14/2014 14.2  11.6 - 15.9 g/dL Final  . HCT 02/14/2014 43.1  34.8 - 46.6 % Final  . Platelets 02/14/2014 254  145 - 400 10e3/uL Final  . MCV 02/14/2014 88.9  79.5 - 101.0 fL Final  . MCH 02/14/2014 29.3  25.1 - 34.0 pg Final  . MCHC 02/14/2014 32.9  31.5 - 36.0 g/dL Final  . RBC 02/14/2014 4.85  3.70 - 5.45 10e6/uL Final  . RDW 02/14/2014 13.4  11.2 - 14.5 % Final  . lymph# 02/14/2014 1.2  0.9 - 3.3 10e3/uL Final  . MONO# 02/14/2014 0.4  0.1 - 0.9 10e3/uL Final  . Eosinophils Absolute 02/14/2014 0.1  0.0 - 0.5 10e3/uL Final  . Basophils Absolute 02/14/2014 0.0  0.0 - 0.1 10e3/uL Final  . NEUT% 02/14/2014 72.6  38.4 - 76.8 % Final  . LYMPH% 02/14/2014 19.1  14.0 - 49.7 % Final    . MONO% 02/14/2014 6.3  0.0 - 14.0 % Final  . EOS% 02/14/2014 1.5  0.0 - 7.0 % Final  . BASO% 02/14/2014 0.5  0.0 - 2.0 % Final  . Sodium 02/14/2014 139  136 - 145 mEq/L Final  . Potassium 02/14/2014 4.0  3.5 - 5.1 mEq/L Final  . Chloride 02/14/2014 103  98 - 109 mEq/L Final  . CO2 02/14/2014 28  22 - 29 mEq/L Final  . Glucose 02/14/2014 114  70 - 140 mg/dl Final  . BUN 02/14/2014 7.2  7.0 - 26.0 mg/dL Final  . Creatinine 02/14/2014 0.8  0.6 - 1.1 mg/dL Final  . Total Bilirubin 02/14/2014 0.45  0.20 - 1.20 mg/dL Final  . Alkaline Phosphatase 02/14/2014 79  40 - 150 U/L Final  . AST 02/14/2014 18  5 - 34 U/L Final  . ALT 02/14/2014 21  0 - 55 U/L Final  . Total Protein 02/14/2014 7.3  6.4 - 8.3 g/dL Final  . Albumin 02/14/2014 4.1  3.5 - 5.0 g/dL Final  . Calcium 02/14/2014 9.5  8.4 - 10.4 mg/dL Final  . Anion Gap 02/14/2014 8  3 - 11 mEq/L Final  . EGFR 02/14/2014 88* >90 ml/min/1.73 m2 Final   eGFR is calculated using the CKD-EPI Creatinine Equation (2009)     RADIOGRAPHIC STUDIES: No results found.  ASSESSMENT/PLAN:    Breast cancer, right breast Patient is status post bilateral mastectomies with reconstruction; and completed chemotherapy in January 2013.  She has been with holding the initiation of tamoxifen until just this past week.  She states she was given up her prescription for tamoxifen; has plans to pick this up and initiate fairly soon.  Cellulitis Patient does appear to have an area of cellulitis directly below her right breast reconstruction site.  This area is erythematous, with slight edema, but no tenderness.  There are no red streaks.  The right breast does appear to be misshapen; and could quite possibly the right breast implant may have ruptured.  The cellulitic area does appear slightly firm.  Cellulitic area was marked.  Patient will obtain an ultrasound and diagnostic mammogram on Tuesday, 02/20/2014.  She was also prescribed Keflex to initiate this evening.   Advised patient to go directed to the emergency department over the weekend if she develops any worsening symptoms whatsoever.  Hypertension Blood pressure on initial check your the cancer Center was 184/125.  Patient typically runs a fairly low blood pressure.  She does not take any blood pressure medication whatsoever.  Patient was given clonidine 0.2 mg  orally while at the cancer center.  Approximately 35-40 minutes later blood pressure had decreased to 163/108.  Patient does admit to added stress in her life at this time; and relates that this may very well be the reason for hypertension.  Patient will also be prescribed hydrochlorothiazide 25 mg to take on a daily basis in the morning.  Advised patient to start the HCTZ tomorrow morning 02/17/2014.  Also advised patient to check her blood pressure frequently over the weekend and to document the blood pressure readings.  Ice patient would call first thing on Monday morning 02/19/2014 to review blood pressure readings and make decision regarding further hypertension medication.  Patient confirmed that she does not have a primary care provider to manage her blood pressure medication.  Also advised patient to go directly to the emergency department over the weekend if she develops any hypertensive symptoms whatsoever.  Patient stated understanding of all instructions; and was in agreement with this plan of care. The patient knows to call the clinic with any problems, questions or concerns.   Review/collaboration with Dr. Jana Hakim regarding all aspects of patient's visit today.   Total time spent with patient was 25 minutes;  with greater than 75 percent of that time spent in face to face counseling regarding her symptoms, and coordination of care and follow up.  Disclaimer: This note was dictated with voice recognition software. Similar sounding words can inadvertently be transcribed and may not be corrected upon review.   Drue Second,  NP 02/16/2014

## 2014-02-16 NOTE — Telephone Encounter (Signed)
Pt called this RN this AM to state new onset since visit on 1/6 with MD of " swollen and hard area under my right breast - I think my implant capsule might have ruptured "  Pt had reconstruction surgery in [place] prior to establishing care under Dr Jannifer Rodney.  Per MD - recommended for pt to obtain an U/S asap of right breast but also for visit today with symptom management to evaluate swelling for other concerns with office closed over the weekend.  U/S and Mammo ( due for yearly ) will be done this Tuesday 1/12 at 115pm at [facility].

## 2014-02-16 NOTE — Assessment & Plan Note (Signed)
Patient does appear to have an area of cellulitis directly below her right breast reconstruction site.  This area is erythematous, with slight edema, but no tenderness.  There are no red streaks.  The right breast does appear to be misshapen; and could quite possibly the right breast implant may have ruptured.  The cellulitic area does appear slightly firm.  Cellulitic area was marked.  Patient will obtain an ultrasound and diagnostic mammogram on Tuesday, 02/20/2014.  She was also prescribed Keflex to initiate this evening.  Advised patient to go directed to the emergency department over the weekend if she develops any worsening symptoms whatsoever.

## 2014-02-16 NOTE — Telephone Encounter (Signed)
SEE OTHER ENTRY 

## 2014-02-16 NOTE — Telephone Encounter (Signed)
See note entered with date 02/16/2014

## 2014-02-16 NOTE — Assessment & Plan Note (Signed)
Patient is status post bilateral mastectomies with reconstruction; and completed chemotherapy in January 2013.  She has been with holding the initiation of tamoxifen until just this past week.  She states she was given up her prescription for tamoxifen; has plans to pick this up and initiate fairly soon.

## 2014-02-19 ENCOUNTER — Other Ambulatory Visit: Payer: Self-pay

## 2014-02-19 ENCOUNTER — Emergency Department (HOSPITAL_COMMUNITY): Payer: Managed Care, Other (non HMO)

## 2014-02-19 ENCOUNTER — Encounter (HOSPITAL_COMMUNITY): Payer: Self-pay | Admitting: *Deleted

## 2014-02-19 ENCOUNTER — Emergency Department (HOSPITAL_COMMUNITY)
Admission: EM | Admit: 2014-02-19 | Discharge: 2014-02-19 | Disposition: A | Discharge status: Home / Self Care | Payer: Managed Care, Other (non HMO) | Attending: Emergency Medicine | Admitting: Emergency Medicine

## 2014-02-19 ENCOUNTER — Telehealth: Payer: Self-pay | Admitting: *Deleted

## 2014-02-19 ENCOUNTER — Telehealth: Payer: Self-pay | Admitting: Oncology

## 2014-02-19 DIAGNOSIS — Z79899 Other long term (current) drug therapy: Secondary | ICD-10-CM | POA: Diagnosis not present

## 2014-02-19 DIAGNOSIS — Z792 Long term (current) use of antibiotics: Secondary | ICD-10-CM | POA: Diagnosis not present

## 2014-02-19 DIAGNOSIS — M7981 Nontraumatic hematoma of soft tissue: Secondary | ICD-10-CM | POA: Insufficient documentation

## 2014-02-19 DIAGNOSIS — Z853 Personal history of malignant neoplasm of breast: Secondary | ICD-10-CM | POA: Insufficient documentation

## 2014-02-19 DIAGNOSIS — I10 Essential (primary) hypertension: Secondary | ICD-10-CM | POA: Diagnosis not present

## 2014-02-19 DIAGNOSIS — R079 Chest pain, unspecified: Secondary | ICD-10-CM

## 2014-02-19 DIAGNOSIS — R002 Palpitations: Secondary | ICD-10-CM | POA: Diagnosis present

## 2014-02-19 LAB — BASIC METABOLIC PANEL
ANION GAP: 11 (ref 5–15)
BUN: 15 mg/dL (ref 6–23)
CO2: 29 mmol/L (ref 19–32)
CREATININE: 0.86 mg/dL (ref 0.50–1.10)
Calcium: 9.7 mg/dL (ref 8.4–10.5)
Chloride: 93 mEq/L — ABNORMAL LOW (ref 96–112)
GFR calc non Af Amer: 82 mL/min — ABNORMAL LOW (ref >90)
Glucose, Bld: 113 mg/dL — ABNORMAL HIGH (ref 70–99)
Potassium: 3.5 mmol/L (ref 3.5–5.1)
Sodium: 133 mmol/L — ABNORMAL LOW (ref 135–145)

## 2014-02-19 LAB — CBC
HEMATOCRIT: 45.1 % (ref 36.0–46.0)
HEMOGLOBIN: 15.1 g/dL — AB (ref 12.0–15.0)
MCH: 29.7 pg (ref 26.0–34.0)
MCHC: 33.5 g/dL (ref 30.0–36.0)
MCV: 88.6 fL (ref 78.0–100.0)
Platelets: 277 10*3/uL (ref 150–400)
RBC: 5.09 MIL/uL (ref 3.87–5.11)
RDW: 13.1 % (ref 11.5–15.5)
WBC: 10.8 10*3/uL — ABNORMAL HIGH (ref 4.0–10.5)

## 2014-02-19 LAB — I-STAT TROPONIN, ED: TROPONIN I, POC: 0.01 ng/mL (ref 0.00–0.08)

## 2014-02-19 NOTE — ED Notes (Signed)
Pt reports she is being treated for infection d/t ruptured breast implant.  Saw her PCP Wednesday for it and was put on abx.  Pt reports elevated BP at the time and was given rx for HCTZ.  She was instructed to come to the ED if her BP becomes elevated again. Pt reports usually her BP runs low.  Pt also reports palpitations today but denies lightheadedness or SOB.

## 2014-02-19 NOTE — Discharge Instructions (Signed)
Please follow the directions provided.  Be sure to follow up with your primary care provider for further management of your high blood pressure.  Your labs and x-ray look reassuring.  Please measure your blood pressure once a day and document in a journal.  Don't hesitate to return for any new, worsening or concerning symptoms.     SEEK IMMEDIATE MEDICAL CARE IF:  You develop a severe headache or confusion.  You have unusual weakness, numbness, or feel faint.  You have severe chest or abdominal pain.  You vomit repeatedly.  You have trouble breathing.

## 2014-02-19 NOTE — ED Provider Notes (Signed)
CSN: [id number]     Arrival date & time 02/19/14  2052 History   First MD Initiated Contact with Patient 02/19/14 2210     Chief Complaint  Patient presents with  . Hypertension  . Palpitations   (Consider location/radiation/quality/duration/timing/severity/associated sxs/prior Treatment) HPI Leslie Sullivan is a 44 yo female presenting with high blood pressure noted today.  She reports she is being evaluated by Dr. Jana Hakim for an infection in her right breast due to a ruptured implant.  When she was at her last appointment it was noted she had some elevated blood pressure.  She was started on HCTZ and told to report to the ED if the pressure remains elevated.  She measured her blood pressure today and it was still elevated between 160s to the 180s, so as instructed, she came to the ED for evaluation.  She does report she felt some palpitations after seeing the elevated number but feels that was due to anxiety over the blood pressure.  She denies any chest pain, shortness of breath, nausea, vomiting, fevers, blurred vision, headache or focal deficit.   Past Medical History  Diagnosis Date  . Breast cancer   . Ectopic pregnancy, tubal     x2   Past Surgical History  Procedure Laterality Date  . Mastectomy      bilateral  . Abdominal hysterectomy      no salpingo-oophorectomy   No family history on file. History  Substance Use Topics  . Smoking status: Never Smoker   . Smokeless tobacco: Not on file  . Alcohol Use: Yes     Comment: socially   OB History    No data available     Review of Systems  Constitutional: Negative for fever and chills.  HENT: Negative for sore throat.   Eyes: Negative for visual disturbance.  Respiratory: Negative for cough and shortness of breath.   Cardiovascular: Positive for palpitations. Negative for chest pain and leg swelling.  Gastrointestinal: Negative for nausea, vomiting and diarrhea.  Genitourinary: Negative for dysuria.  Musculoskeletal:  Negative for myalgias.  Skin: Negative for rash.  Neurological: Negative for weakness, numbness and headaches.    Allergies  Review of patient's allergies indicates no known allergies.  Home Medications   Prior to Admission medications   Medication Sig Start Date End Date Taking? Authorizing Provider  cephALEXin (KEFLEX) 500 MG capsule Take 1 capsule (500 mg total) by mouth 4 (four) times daily. 02/16/14   Drue Second, NP  hydrochlorothiazide (HYDRODIURIL) 25 MG tablet Take 1 tablet (25 mg total) by mouth daily. 02/16/14   Drue Second, NP  MULTIPLE MINERALS PO Take by mouth. Pt takes zinc, fish oil, thyraxis,vitamin d and Immuncare    Historical Provider, MD  MULTIPLE VITAMINS PO Take by mouth.    Historical Provider, MD  tamoxifen (NOLVADEX) 20 MG tablet Take 1 tablet (20 mg total) by mouth daily. 02/14/14 03/16/14  Virgie Dad Magrinat, MD   BP 165/100 mmHg  Pulse 106  Temp(Src) 98.3 F (36.8 C) (Oral)  Resp 24  SpO2 100% Physical Exam  Constitutional: She is oriented to person, place, and time. She appears well-developed and well-nourished. No distress.  HENT:  Head: Normocephalic and atraumatic.  Mouth/Throat: Oropharynx is clear and moist. No oropharyngeal exudate.  Eyes: Conjunctivae are normal. Pupils are equal, round, and reactive to light.  Neck: Neck supple. No thyromegaly present.  Cardiovascular: Normal rate, regular rhythm and intact distal pulses.   Pulmonary/Chest: Effort normal and breath sounds normal. No respiratory  distress. She has no wheezes. She has no rales. She exhibits no tenderness. Right breast exhibits skin change.    Abdominal: Soft. There is no tenderness.  Musculoskeletal: She exhibits no tenderness.  Lymphadenopathy:    She has no cervical adenopathy.  Neurological: She is alert and oriented to person, place, and time. She has normal strength. No cranial nerve deficit or sensory deficit. Coordination normal. GCS eye subscore is 4. GCS verbal subscore is  5. GCS motor subscore is 6.  Skin: Skin is warm and dry. No rash noted. She is not diaphoretic. There is erythema.  Psychiatric: She has a normal mood and affect.  Nursing note and vitals reviewed.   ED Course  Procedures (including critical care time) Labs Review Labs Reviewed  CBC - Abnormal; Notable for the following:    WBC 10.8 (*)    Hemoglobin 15.1 (*)    All other components within normal limits  BASIC METABOLIC PANEL - Abnormal; Notable for the following:    Sodium 133 (*)    Chloride 93 (*)    Glucose, Bld 113 (*)    GFR calc non Af Amer 82 (*)    All other components within normal limits  I-STAT TROPOININ, ED    Imaging Review Dg Chest 2 View  02/19/2014   CLINICAL DATA:  Chest pain, elevated blood pressure tonight, palpitations, breast cancer post RIGHT mastectomy 2 years ago, RIGHT implant rupture, on antibiotics  EXAM: CHEST  2 VIEW  COMPARISON:  None  FINDINGS: BILATERAL breast prostheses.  Normal heart size, mediastinal contours, and pulmonary vascularity.  Lungs clear.  No pleural effusion or pneumothorax.  Bones unremarkable.  IMPRESSION: No acute abnormalities.   Electronically Signed   By: Lavonia Dana M.D.   On: 02/19/2014 22:03     EKG Interpretation   Date/Time:  Monday February 19 2014 21:20:36 EST Ventricular Rate:  99 PR Interval:  199 QRS Duration: 107 QT Interval:  369 QTC Calculation: 473 R Axis:   89 Text Interpretation:  Sinus rhythm Borderline prolonged PR interval Left  atrial enlargement Nonspecific T abnormalities, anterior leads ED  PHYSICIAN INTERPRETATION AVAILABLE IN [facility] Confirmed by TEST,  Record ([id number]) on 02/21/2014 8:21:39 AM      MDM   Final diagnoses:  Essential hypertension   44 yo with asymptomatic high blood pressure. Discussed case with Dr. Tamera Punt. She denies any pain, chest pain, shortness of breath, blurred vision, or focal deficit.  Her neuro exam is normal.  She has a follow-up visit with her doctor in 2  days.  Discussed measuring blood pressure at the same time each day in the same circumstances.  Return precautions discussed include any chest pain shortness of breath, blurred vision, sudden headache, or focal neuro deficit. Pt is well-appearing, in no acute distress and vital signs reviewed.  They appear safe to be discharged.  Discharge include follow-up with their PCP.  She is aware of plan and in agreement.    Filed Vitals:   02/19/14 2111 02/19/14 2211 02/19/14 2314  BP: 139/97 165/100 147/106  Pulse: 109 106 105  Temp: 98.3 F (36.8 C)    TempSrc: Oral    Resp: 16 24 17   SpO2: 100% 100% 98%   Meds given in ED:  Medications - No data to display  Discharge Medication List as of 02/19/2014 11:16 PM         Britt Bottom, NP 02/21/14 [place], MD 02/27/14 [id number]

## 2014-02-19 NOTE — ED Notes (Signed)
Redness noted under her R breast-marked by Dr. Jana Hakim Wednesday.  Redness noted to be getting bigger

## 2014-02-19 NOTE — Telephone Encounter (Signed)
Patient reports cellulitis "looks great; it hasn't moved outside of the line Cyndee drew on Friday". BP has been elevated: 130s/90s,180s/100s. Patient denies pain, headache, visual changes, or dizziness.  Selena Lesser NP made aware of elevated BPs. States to forward information to Dr. Jana Hakim; Ailene Ravel RN made aware and to give information to MD.

## 2014-02-21 ENCOUNTER — Ambulatory Visit (HOSPITAL_BASED_OUTPATIENT_CLINIC_OR_DEPARTMENT_OTHER): Payer: Managed Care, Other (non HMO) | Admitting: Nurse Practitioner

## 2014-02-21 ENCOUNTER — Encounter: Payer: Self-pay | Admitting: Nurse Practitioner

## 2014-02-21 VITALS — BP 160/110 | HR 92 | Temp 98.9°F | Resp 18 | Wt 215.6 lb

## 2014-02-21 DIAGNOSIS — L03313 Cellulitis of chest wall: Secondary | ICD-10-CM

## 2014-02-21 DIAGNOSIS — N650 Deformity of reconstructed breast: Secondary | ICD-10-CM

## 2014-02-21 DIAGNOSIS — I10 Essential (primary) hypertension: Secondary | ICD-10-CM

## 2014-02-21 DIAGNOSIS — C50911 Malignant neoplasm of unspecified site of right female breast: Secondary | ICD-10-CM

## 2014-02-21 DIAGNOSIS — T8543XA Leakage of breast prosthesis and implant, initial encounter: Secondary | ICD-10-CM | POA: Insufficient documentation

## 2014-02-21 NOTE — Progress Notes (Signed)
ID: Leslie Sullivan   DOB: 04-Jan-1971  MR#: [id number]  TKZ#:[id number]  PCP: Pcp Not In System GYN:  SU:  OTHER MD:  CHIEF COMPLAINT: Estrogen receptor positive breast cancer  CURRENT TREATMENT: Tamoxifen   HISTORY OF PRESENT ILLNESS: From the original intake note:  The patient noted a mass in the right breast which appeared to change with her cycle. Mammography on 09/01/2010, however, at the [facility] in [place] showed a density in the medial aspect of the right breast, confirmed by additional views and ultrasonography, which showed a hypoechoic mass measuring 2.3 cm in the medial right breast. This was biopsied 09/12/2010, and showed (accession number [facility] [id number]) an invasive ductal carcinoma, grade 1, estrogen and progesterone receptor positive (both 3+), with an intermediate Ki-67, and an indeterminate HER-2 at 2+, further evaluated by [facility], which was negative.  The patient underwent bilateral breast MRI 09/19/2010. This confirmed the spiculated mass in the right breast upper inner aspect measuring 2.6 cm. Immediately anterior and medial to this was a small smooth nodule measuring 5 mm which was nonspecific. A third nodule in the lateral right breast measuring 1.7 mm was felt to be likely consistent with a fibroadenoma. In the left breast there were 2 foci of enhancement. 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 With this information and after appropriate discussion, the patient opted for bilateral mastectomies with right sentinel lymph node sampling. This was performed 10/24/2010, and showed ([id number]) on the right side, and invasive ductal carcinoma measuring 2.3 cm, grade 1, with one out of 9 lymph nodes sampled (2 of them being sentinel lymph nodes) positive. The left breast was benign.   Her subsequent history is as detailed  below.  INTERVAL HISTORY: Leslie Sullivan returns today for followup of her breast cancer, accompanied by her husband. She had a follow up visit with Dr. Jana Sullivan last week, but the very next day she visited the symptom management clinic for what appeared to be cellulitis under the right breast. She was started on keflex for 10 days, she is about halfway through the course of abx and there is only minimal improvement, it is certainly no worse. Her bilateral breast were reconstructed in 2012 and it looks as if the right breast has ruptured, due to it suddenly being misshapen. The patient was sent for a mammogram and ultrasound and it was revealed that both breast implants have failed and are ruptured. Leslie Sullivan does not have a Psychologist, sport and exercise in place here in [place], as her implants were placed in [place]. Her blood pressure has been elevated for her past 2 visits and is elevated as well today. She was started on HCTZ 55m last week and she has taken it faithfully daily. Also, Leslie Sullivan originally against anti-estrogen therapy but is in the process of reconsidering it. She has picked up the prescription, but has not yet started the drug.   REVIEW OF SYSTEMS: Leslie Sullivan to experience right a bit of stress at work although she likes her job. It's the newness of that. Of course moving and separating are stressful in themselves. In addition she is concerned of course about her breast cancer. She sleeps poorly. She is gained more weight. She is not exercising at all. She has an upset stomach and occasionally loose bowel movements. She denies unusual headaches, visual changes, nausea, vomiting, dizziness, or gait imbalance. There has not  been any unusual cough, shortness of breath, or pleurisy. She denies pain, fever, rash, or bleeding problems. A detailed review of systems today was otherwise stable  PAST MEDICAL HISTORY: Past Medical History  Diagnosis Date  . Breast cancer   . Ectopic pregnancy, tubal     x2    PAST  SURGICAL HISTORY: Past Surgical History  Procedure Laterality Date  . Mastectomy      bilateral  . Abdominal hysterectomy      no salpingo-oophorectomy    FAMILY HISTORY  (updated September 2013) No family history on file. The patient's parents are alive, in their 28s. The patient has no brothers, 4 sisters. The patient's mother was diagnosed with cervical cancer at the age of 74. The patient's father was diagnosed with prostate cancer the age of 25. There is no history of breast or ovarian cancer in the immediate family.  GYNECOLOGIC HISTORY: Menarche age 51, first live birth age 13. She is GX P1. The patient underwent hysterectomy in May of 2012. There was no salpingo-oophorectomy. She never used hormone replacement. 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 Her son, Leslie Sullivan,  studies at the [employer]. The patient lives alone, with no pets.   ADVANCED DIRECTIVES: Not in place  HEALTH MAINTENANCE: History  Substance Use Topics  . Smoking status: Never Smoker   . Smokeless tobacco: Not on file  . Alcohol Use: Yes     Comment: socially     Colonoscopy:  PAP:  Bone density:  Lipid panel:  No Known Allergies  Current Outpatient Prescriptions  Medication Sig Dispense Refill  . cephALEXin (KEFLEX) 500 MG capsule Take 1 capsule (500 mg total) by mouth 4 (four) times daily. 40 capsule 0  . hydrochlorothiazide (HYDRODIURIL) 25 MG tablet Take 1 tablet (25 mg total) by mouth daily. 10 tablet 0  . tamoxifen (NOLVADEX) 20 MG tablet Take 1 tablet (20 mg total) by mouth daily. (Patient not taking: Reported on 02/21/2014) 90 tablet 12   No current facility-administered medications for this visit.    OBJECTIVE: Young white woman in no acute distress Filed Vitals:   02/21/14 1424  BP: 160/110  Pulse: 92  Temp: 98.9 F (37.2 C)  Resp: 18     Body mass index is 35.88 kg/(m^2).    ECOG FS:  0  Skin: warm, dry  HEENT: sclerae anicteric, conjunctivae pink, oropharynx clear. No thrush or mucositis.  Lymph Nodes: No cervical or supraclavicular lymphadenopathy  Lungs: clear to auscultation bilaterally, no rales, wheezes, or rhonci  Heart: regular rate and rhythm  Abdomen: round, soft, non tender, positive bowel sounds  Musculoskeletal: No focal spinal tenderness, no peripheral edema  Neuro: non focal, well oriented, positive affect  Breasts: status post bilateral mastectomies which are both ruptured and now misshapen. Mild erythema below right breast, minimally better since last week.   LAB RESULTS: Basic Metabolic Panel:  Recent Labs Lab 02/19/14 2126  NA 133*  K 3.5  CL 93*  CO2 29  GLUCOSE 113*  BUN 15  CREATININE 0.86  CALCIUM 9.7   GFR Estimated Creatinine Clearance: 97.6 mL/min (by C-G formula based on Cr of 0.86). Liver Function Tests: No results for input(s): AST, ALT, ALKPHOS, BILITOT, PROT, ALBUMIN in the last 168 hours. No results for input(s): LIPASE, AMYLASE in the last 168 hours. No results for input(s): AMMONIA in the last 168 hours. Coagulation profile No results for input(s):  INR, PROTIME in the last 168 hours.  CBC:  Recent Labs Lab 02/19/14 2126  WBC 10.8*  HGB 15.1*  HCT 45.1  MCV 88.6  PLT 277   Cardiac Enzymes: No results for input(s): CKTOTAL, CKMB, CKMBINDEX, TROPONINI in the last 168 hours. BNP: Invalid input(s): POCBNP CBG: No results for input(s): GLUCAP in the last 168 hours. D-Dimer No results for input(s): DDIMER in the last 72 hours. Hgb A1c No results for input(s): HGBA1C in the last 72 hours. Lipid Profile No results for input(s): CHOL, HDL, LDLCALC, TRIG, CHOLHDL, LDLDIRECT in the last 72 hours. Thyroid function studies No results for input(s): TSH, T4TOTAL, T3FREE, THYROIDAB in the last 72 hours.  Invalid input(s): FREET3 Anemia work up No results for input(s): VITAMINB12, FOLATE, FERRITIN, TIBC, IRON,  RETICCTPCT in the last 72 hours. Sepsis Labs Invalid input(s): PROCALCITONIN,  WBC,  LACTICIDVEN Microbiology No results found for this or any previous visit (from the past 240 hour(s)).   No components found for: LABCA125  No results for input(s): INR in the last 168 hours.  Urinalysis    Component Value Date/Time   LABSPEC 1.025 10/05/2011 1934   PHURINE 6.0 10/05/2011 1934   GLUCOSEU NEGATIVE 10/05/2011 1934   HGBUR TRACE* 10/05/2011 1934   BILIRUBINUR SMALL* 10/05/2011 1934   KETONESUR 40* 10/05/2011 1934   PROTEINUR 30* 10/05/2011 1934   UROBILINOGEN 0.2 10/05/2011 1934   NITRITE NEGATIVE 10/05/2011 1934   LEUKOCYTESUR SMALL* 10/05/2011 1934    STUDIES: Dg Chest 2 View  02/19/2014   CLINICAL DATA:  Chest pain, elevated blood pressure tonight, palpitations, breast cancer post RIGHT mastectomy 2 years ago, RIGHT implant rupture, on antibiotics  EXAM: CHEST  2 VIEW  COMPARISON:  None  FINDINGS: BILATERAL breast prostheses.  Normal heart size, mediastinal contours, and pulmonary vascularity.  Lungs clear.  No pleural effusion or pneumothorax.  Bones unremarkable.  IMPRESSION: No acute abnormalities.   Electronically Signed   By: Lavonia Dana M.D.   On: 02/19/2014 22:03     ASSESSMENT: 45 y.o. BRCA negative [place] woman  (1) s/p bilateral mastectomies and Right sentinel lymph node sampling 10/24/2010 (with a total of 9 axillary nodes removed) for a Right-sided T2 N1, stage IIB invasive ductal carcinoma, grade 1, strongly estrogen and progesterone receptor positive, HER-2 negative by FISH, with an intermediate Ki-67  (2) s/p cyclophosphamide/ docetaxel x6 completed January 2013  (3) no adjuvant radiation therapy  (4)  The adjuvant! database would quote her a 20% risk of dying from breast cancer within the next 10 years with local treatment (surgery) only. Chemotherapy decreases that risk to about 14%. Optimal antiestrogen therapy can cut residual risk in half, which means  she would have a 7% decrease in mortality at 10 years if she took tamoxifen and aromatase inhibitors in some combination.  (5) decided against anti-estrogen therapy  PLAN: Leslie Sullivan and I spent about 25 minutes discussing her breast implant ruptures, her qualms against tamoxifen, and her blood pressure. I have obtained copies of her ultrasound, confirming bilateral rupture. I contacted Leslie Sullivan and Leslie Sullivan has an appointment tomorrow at 1:30pm. I am also working on having her records faxed to this facility now. We do not currently have her surgical records from [place] on file. She will continue the keflex, unless Leslie Sullivan changes the plan in this regard.   Leslie Sullivan and I discussed tamoxifen. She knows the reduction in risk she stands to gain by utilizing this drug. She will give this some more thought.  I advised Leslie Sullivan to obtain a PCP. I do not believe the HCTZ is doing much for her. She is logging her BPs twice daily. She claims it was in the 130s/80s earlier at home. She is 160/110 in office today. I suggested we move on to try low dose lisnopril or norvasc and she was reluctant. At the least she promises to reduce her sodium intake and exercise more. There is a fair amount of stress in her life as well that is likely contributing.   Mckyla will return as scheduled to her follow up visit that was made last week, which will be in May of this year. She understands and agrees with this plan. She knows the goal of treatment in her case is cure. She has been encouraged to call with any issues that might arise before her next visit here.  Leslie Sullivan    02/21/2014

## 2014-02-22 ENCOUNTER — Telehealth: Payer: Self-pay | Admitting: Oncology

## 2014-02-22 NOTE — Telephone Encounter (Signed)
MEDICAL RECORDS FAXED TO DR. [facility] @ [phone]

## 2014-02-26 ENCOUNTER — Other Ambulatory Visit: Payer: Managed Care, Other (non HMO)

## 2014-02-26 ENCOUNTER — Other Ambulatory Visit: Payer: Self-pay | Admitting: Oncology

## 2014-02-27 ENCOUNTER — Telehealth: Payer: Self-pay | Admitting: *Deleted

## 2014-02-27 DIAGNOSIS — Z853 Personal history of malignant neoplasm of breast: Secondary | ICD-10-CM

## 2014-02-27 DIAGNOSIS — I10 Essential (primary) hypertension: Secondary | ICD-10-CM

## 2014-02-27 MED ORDER — HYDROCHLOROTHIAZIDE 25 MG PO TABS: 25.0000 mg | ORAL_TABLET | Freq: Every day | ORAL | Status: DC

## 2014-02-27 NOTE — Telephone Encounter (Signed)
Pt left message stating she took her last BP pill given to her by Dr Jannifer Rodney-   " when I came to see him for follow up he found my bp elevated - and gave me prescription for 10 days of medication requesting for me to be seen by a primary MD "  Pt is not scheduled to be established for primary care until early Feb.  Refill given on HCTZ until appointment with primary MD.  Of note during conversation - Analyssa states she went to see Dr Leland Johns and would like a second opinion.  This RN will place referral for appointment with Dr Harlow Mares.  Pt has completed antibiotic and overall lump is present but she does not have any redness or other signs of cellulitis.

## 2014-02-28 ENCOUNTER — Telehealth: Payer: Self-pay | Admitting: Oncology

## 2014-02-28 NOTE — Telephone Encounter (Signed)
s.w pt and advised that Dr. Harlow Mares office will contact her to sched appt....i also gv pt [place] # if she has any questions

## 2014-02-28 NOTE — Telephone Encounter (Signed)
Faxed referral to Dr Harlow Mares [phone]

## 2014-03-06 ENCOUNTER — Encounter: Payer: Self-pay | Admitting: Nurse Practitioner

## 2014-03-06 ENCOUNTER — Ambulatory Visit (INDEPENDENT_AMBULATORY_CARE_PROVIDER_SITE_OTHER): Payer: Managed Care, Other (non HMO) | Admitting: Nurse Practitioner

## 2014-03-06 VITALS — BP 130/88 | HR 64 | Ht 66.0 in | Wt 214.0 lb

## 2014-03-06 DIAGNOSIS — Z01419 Encounter for gynecological examination (general) (routine) without abnormal findings: Secondary | ICD-10-CM

## 2014-03-06 DIAGNOSIS — C50911 Malignant neoplasm of unspecified site of right female breast: Secondary | ICD-10-CM

## 2014-03-06 DIAGNOSIS — Z0000 Encounter for general adult medical examination without abnormal findings: Secondary | ICD-10-CM

## 2014-03-06 DIAGNOSIS — I10 Essential (primary) hypertension: Secondary | ICD-10-CM

## 2014-03-06 DIAGNOSIS — L03313 Cellulitis of chest wall: Secondary | ICD-10-CM

## 2014-03-06 LAB — POCT URINALYSIS DIPSTICK
Bilirubin, UA: NEGATIVE
Glucose, UA: NEGATIVE
KETONES UA: NEGATIVE
LEUKOCYTES UA: NEGATIVE
Nitrite, UA: NEGATIVE
PH UA: 6.5
PROTEIN UA: NEGATIVE
RBC UA: NEGATIVE
Urobilinogen, UA: NEGATIVE

## 2014-03-06 LAB — THYROID PANEL WITH TSH
FREE THYROXINE INDEX: 2.1 (ref 1.4–3.8)
T3 Uptake: 28 % (ref 22–35)
T4, Total: 7.5 ug/dL (ref 4.5–12.0)
TSH: 0.526 u[IU]/mL (ref 0.350–4.500)

## 2014-03-06 MED ORDER — CEPHALEXIN 500 MG PO CAPS: 500.0000 mg | ORAL_CAPSULE | Freq: Four times a day (QID) | ORAL | Status: DC

## 2014-03-06 MED ORDER — FLUCONAZOLE 150 MG PO TABS: 150.0000 mg | ORAL_TABLET | Freq: Once | ORAL | Status: DC

## 2014-03-06 NOTE — Patient Instructions (Signed)

## 2014-03-06 NOTE — Progress Notes (Signed)
Patient ID: Leslie Sullivan, female   DOB: [birth year], 44 y.o.   MRN: [id number] 44 y.o. [id number] Married  Caucasian Fe here for [place] annual exam.  She has complaints about recent weight gain over past 2 years after a move.  Drinking more ETOH than normal secondary to stress.  Moved to [place] for husbands job but very unhappy, and moved back to [place] in November.   She also has diagnosis of breast cancer at age 60.   Original diagnosis and surgery was in [place].  She has results of hormone testing with her and will confirm the findings.  She thinks ER+/ PR- Her 2NU negative. BRCA I/II negative.  Prior hysterectomy was for DUB.  One ovary removed with tubal pregnacy.    Pre surgery mammogram is the reason for finding the breast cancer.  Most recently has a right implant leakage.  Is seeing surgeon now.  On Keflex secondary to cellulitis, completed Keflex therapy 02/26/14.  Had bladder suspension with hysterectomy.  Patient's last menstrual period was 04/10/2010 (approximate).          Sexually active: Yes.    The current method of family planning is status post hysterectomy.    Exercising: No.  The patient does not participate in regular exercise at present. Smoker:  no  Health Maintenance: Pap:  Prior to 2012 hysterectomy, previous positive with colpo x 2 in 30's MMG:  2012 with breast cancer diagnosis, unable to repeat at this time due to leaking implants, repeat recommended by oncology TDaP:  ? Labs:  [facility], PCP  Urine:  negative   reports that she has never smoked. She has never used smokeless tobacco. She reports that she drinks alcohol. She reports that she does not use illicit drugs.  Past Medical History  Diagnosis Date  . Ectopic pregnancy, tubal about 1996, 1998    x2  . Hypertension   . Breast cancer     Left, chemo 09/2010-03/2011, no radiation, bilateral mastectomy    Past Surgical History  Procedure Laterality Date  . Mastectomy Bilateral 10/2010       . Laparoscopic  assisted vaginal hysterectomy  03/2010    DUB - ovaries remain, tubes previously removed  . Ectopic pregnancy surgery  1995, 1996    tubes removed in 1996  . Breast enhancement surgery Bilateral 05/2011    implants following mastectomy  . Bladder suspension  03/2010    mesh, with hyst in 2012    Current Outpatient Prescriptions  Medication Sig Dispense Refill  . hydrochlorothiazide (HYDRODIURIL) 25 MG tablet Take 1 tablet (25 mg total) by mouth daily. 30 tablet 0  . cephALEXin (KEFLEX) 500 MG capsule Take 1 capsule (500 mg total) by mouth 4 (four) times daily. Take QID for 7 days. 28 capsule 0  . fluconazole (DIFLUCAN) 150 MG tablet Take 1 tablet (150 mg total) by mouth once. Take one tablet.  Repeat in 48 hours if symptoms are not completely resolved. 2 tablet 1  . tamoxifen (NOLVADEX) 20 MG tablet Take 1 tablet (20 mg total) by mouth daily. (Patient not taking: Reported on 02/21/2014) 90 tablet 12   No current facility-administered medications for this visit.    Family History  Problem Relation Age of Onset  . Lung cancer Maternal Grandmother     smoker  . Diabetes Paternal Grandmother   . Heart attack Paternal Grandfather     before 1 years old  . Alzheimer's disease Paternal Grandfather   . Thyroid disease Mother   .  Thyroid disease Sister     ROS:  Pertinent items are noted in HPI.  Otherwise, a comprehensive ROS was negative.  Exam:   BP 130/88 mmHg  Pulse 64  Ht 5' 6"  (1.676 m)  Wt 214 lb (97.07 kg)  BMI 34.56 kg/m2  LMP 04/10/2010 (Approximate) Height: 5' 6"  (167.6 cm) Ht Readings from Last 3 Encounters:  03/06/14 5' 6"  (1.676 m)  02/16/14 5' 5"  (1.651 m)  02/14/14 5' 5"  (1.651 m)    General appearance: alert, cooperative and appears stated age Head: Normocephalic, without obvious abnormality, atraumatic Neck: no adenopathy, supple, symmetrical, trachea midline and thyroid normal to inspection and palpation - prominent neck folds without enlargement of the  thyroid Lungs: clear to auscultation bilaterally Breasts: bilateral mastectomy site with implants - there is a rupture on the right.  Below the right breast is a wide area of cellulitis that remains warm with redness. Heart: regular rate and rhythm Abdomen: soft, non-tender; no masses,  no organomegaly Extremities: extremities normal, atraumatic, no cyanosis or edema Skin: Skin color, texture, turgor normal. No rashes or lesions Lymph nodes: Cervical, supraclavicular, and axillary nodes normal. No abnormal inguinal nodes palpated Neurologic: Grossly normal   Pelvic: External genitalia:  no lesions              Urethra:  normal appearing urethra with no masses, tenderness or lesions              Bartholin's and Skene's: normal                 Vagina: normal appearing vagina with normal color and discharge, no lesions              Cervix: absent              Pap taken: Yes.   Bimanual Exam:  Uterus:  uterus absent              Adnexa: no mass, fullness, tenderness               Rectovaginal: Confirms               Anus:  normal sphincter tone, no lesions  Chaperone present: Yes  A:  Well Woman with normal exam  S/P LAVH 03/2010 secondary to DUB  S/P bilateral mastectomy secondary to right breast cancer 10/2010 treated with chemo  Recent right breast implant rupture with cellulitis treated with Keflex by Oncologist and is now followed by surgeon on 03/14/14  ? Thyroid dysfunction - [facility] of thyroid disease  P:   Reviewed health and wellness pertinent to exam  Pap smear taken today  Mammogram is not indicated yet with current implant rupture  Will restart Keflex since the area is still warm and red at least for 3-5 more days.  Will get Thyroid panel and TSH  Counseled on breast self exam, adequate intake of calcium and vitamin D, diet and exercise, Kegel's exercises return annually or prn  An After Visit Summary was printed and given to the patient.

## 2014-03-08 LAB — IPS PAP TEST WITH HPV

## 2014-03-11 NOTE — Progress Notes (Signed)
Pt needs breast recheck after Keflex is completed either with [place] or breast surgeon.  Reviewed personally.  Felipa Emory, MD.

## 2014-03-12 ENCOUNTER — Encounter: Payer: Self-pay | Admitting: Nurse Practitioner

## 2014-03-15 ENCOUNTER — Other Ambulatory Visit (INDEPENDENT_AMBULATORY_CARE_PROVIDER_SITE_OTHER): Payer: Managed Care, Other (non HMO)

## 2014-03-15 ENCOUNTER — Ambulatory Visit (INDEPENDENT_AMBULATORY_CARE_PROVIDER_SITE_OTHER): Payer: Managed Care, Other (non HMO) | Admitting: Family

## 2014-03-15 ENCOUNTER — Encounter: Payer: Self-pay | Admitting: Family

## 2014-03-15 VITALS — BP 140/86 | HR 60 | Temp 98.4°F | Resp 18 | Ht 67.0 in | Wt 214.0 lb

## 2014-03-15 DIAGNOSIS — Z0000 Encounter for general adult medical examination without abnormal findings: Secondary | ICD-10-CM

## 2014-03-15 DIAGNOSIS — I10 Essential (primary) hypertension: Secondary | ICD-10-CM

## 2014-03-15 LAB — LIPID PANEL
CHOL/HDL RATIO: 4
Cholesterol: 198 mg/dL (ref 0–200)
HDL: 49 mg/dL (ref >39.00)
LDL CALC: 126 mg/dL — AB (ref 0–99)
NONHDL: 149
TRIGLYCERIDES: 114 mg/dL (ref 0.0–149.0)
VLDL: 22.8 mg/dL (ref 0.0–40.0)

## 2014-03-15 LAB — CBC
HEMATOCRIT: 41.5 % (ref 36.0–46.0)
Hemoglobin: 14.2 g/dL (ref 12.0–15.0)
MCHC: 34.1 g/dL (ref 30.0–36.0)
MCV: 86.1 fl (ref 78.0–100.0)
Platelets: 268 10*3/uL (ref 150.0–400.0)
RBC: 4.82 Mil/uL (ref 3.87–5.11)
RDW: 13.5 % (ref 11.5–15.5)
WBC: 5.9 10*3/uL (ref 4.0–10.5)

## 2014-03-15 MED ORDER — HYDROCHLOROTHIAZIDE 25 MG PO TABS: 25.0000 mg | ORAL_TABLET | Freq: Every day | ORAL | Status: DC

## 2014-03-15 NOTE — Patient Instructions (Addendum)
Thank you for choosing [facility].  Summary/Instructions:  Your prescription(s) have been submitted to your pharmacy or been printed and provided for you. Please take as directed and contact our office if you believe you are having problem(s) with the medication(s) or have any questions.  Please stop by the lab on the basement level of the building for your blood work. Your results will be released to MyChart (or called to you) after review, usually within 72 hours after test completion. If any changes need to be made, you will be notified at that same time.  Health Maintenance Adopting a healthy lifestyle and getting preventive care can go a long way to promote health and wellness. Talk with your health care provider about what schedule of regular examinations is right for you. This is a good chance for you to check in with your provider about disease prevention and staying healthy. In between checkups, there are plenty of things you can do on your own. Experts have done a lot of research about which lifestyle changes and preventive measures are most likely to keep you healthy. Ask your health care provider for more information. WEIGHT AND DIET  Eat a healthy diet  Be sure to include plenty of vegetables, fruits, low-fat dairy products, and lean protein.  Do not eat a lot of foods high in solid fats, added sugars, or salt.  Get regular exercise. This is one of the most important things you can do for your health.  Most adults should exercise for at least 150 minutes each week. The exercise should increase your heart rate and make you sweat (moderate-intensity exercise).  Most adults should also do strengthening exercises at least twice a week. This is in addition to the moderate-intensity exercise.  Maintain a healthy weight  Body mass index (BMI) is a measurement that can be used to identify possible weight problems. It estimates body fat based on height and weight. Your health  care provider can help determine your BMI and help you achieve or maintain a healthy weight.  For females 20 years of age and older:   A BMI below 18.5 is considered underweight.  A BMI of 18.5 to 24.9 is normal.  A BMI of 25 to 29.9 is considered overweight.  A BMI of 30 and above is considered obese.  Watch levels of cholesterol and blood lipids  You should start having your blood tested for lipids and cholesterol at 44 years of age, then have this test every 5 years.  You may need to have your cholesterol levels checked more often if:  Your lipid or cholesterol levels are high.  You are older than 44 years of age.  You are at high risk for heart disease.  CANCER SCREENING   Lung Cancer  Lung cancer screening is recommended for adults 55-80 years old who are at high risk for lung cancer because of a history of smoking.  A yearly low-dose CT scan of the lungs is recommended for people who:  Currently smoke.  Have quit within the past 15 years.  Have at least a 30-pack-year history of smoking. A pack year is smoking an average of one pack of cigarettes a day for 1 year.  Yearly screening should continue until it has been 15 years since you quit.  Yearly screening should stop if you develop a health problem that would prevent you from having lung cancer treatment.  Breast Cancer  Practice breast self-awareness. This means understanding how your breasts normally appear   and feel.  It also means doing regular breast self-exams. Let your health care provider know about any changes, no matter how small.  If you are in your 20s or 30s, you should have a clinical breast exam (CBE) by a health care provider every 1-3 years as part of a regular health exam.  If you are 40 or older, have a CBE every year. Also consider having a breast X-ray (mammogram) every year.  If you have a family history of breast cancer, talk to your health care provider about genetic  screening.  If you are at high risk for breast cancer, talk to your health care provider about having an MRI and a mammogram every year.  Breast cancer gene (BRCA) assessment is recommended for women who have family members with BRCA-related cancers. BRCA-related cancers include:  Breast.  Ovarian.  Tubal.  Peritoneal cancers.  Results of the assessment will determine the need for genetic counseling and BRCA1 and BRCA2 testing. Cervical Cancer Routine pelvic examinations to screen for cervical cancer are no longer recommended for nonpregnant women who are considered low risk for cancer of the pelvic organs (ovaries, uterus, and vagina) and who do not have symptoms. A pelvic examination may be necessary if you have symptoms including those associated with pelvic infections. Ask your health care provider if a screening pelvic exam is right for you.   The Pap test is the screening test for cervical cancer for women who are considered at risk.  If you had a hysterectomy for a problem that was not cancer or a condition that could lead to cancer, then you no longer need Pap tests.  If you are older than 65 years, and you have had normal Pap tests for the past 10 years, you no longer need to have Pap tests.  If you have had past treatment for cervical cancer or a condition that could lead to cancer, you need Pap tests and screening for cancer for at least 20 years after your treatment.  If you no longer get a Pap test, assess your risk factors if they change (such as having a new sexual partner). This can affect whether you should start being screened again.  Some women have medical problems that increase their chance of getting cervical cancer. If this is the case for you, your health care provider may recommend more frequent screening and Pap tests.  The human papillomavirus (HPV) test is another test that may be used for cervical cancer screening. The HPV test looks for the virus that can  cause cell changes in the cervix. The cells collected during the Pap test can be tested for HPV.  The HPV test can be used to screen women 30 years of age and older. Getting tested for HPV can extend the interval between normal Pap tests from three to five years.  An HPV test also should be used to screen women of any age who have unclear Pap test results.  After 44 years of age, women should have HPV testing as often as Pap tests.  Colorectal Cancer  This type of cancer can be detected and often prevented.  Routine colorectal cancer screening usually begins at 44 years of age and continues through 44 years of age.  Your health care provider may recommend screening at an earlier age if you have risk factors for colon cancer.  Your health care provider may also recommend using home test kits to check for hidden blood in the stool.  A small   camera at the end of a tube can be used to examine your colon directly (sigmoidoscopy or colonoscopy). This is done to check for the earliest forms of colorectal cancer.  Routine screening usually begins at age 47.  Direct examination of the colon should be repeated every 5-10 years through 44 years of age. However, you may need to be screened more often if early forms of precancerous polyps or small growths are found. Skin Cancer  Check your skin from head to toe regularly.  Tell your health care provider about any new moles or changes in moles, especially if there is a change in a mole's shape or color.  Also tell your health care provider if you have a mole that is larger than the size of a pencil eraser.  Always use sunscreen. Apply sunscreen liberally and repeatedly throughout the day.  Protect yourself by wearing long sleeves, pants, a wide-brimmed hat, and sunglasses whenever you are outside. HEART DISEASE, DIABETES, AND HIGH BLOOD PRESSURE   Have your blood pressure checked at least every 1-2 years. High blood pressure causes heart  disease and increases the risk of stroke.  If you are between 61 years and 67 years old, ask your health care provider if you should take aspirin to prevent strokes.  Have regular diabetes screenings. This involves taking a blood sample to check your fasting blood sugar level.  If you are at a normal weight and have a low risk for diabetes, have this test once every three years after 44 years of age.  If you are overweight and have a high risk for diabetes, consider being tested at a younger age or more often. PREVENTING INFECTION  Hepatitis B  If you have a higher risk for hepatitis B, you should be screened for this virus. You are considered at high risk for hepatitis B if:  You were born in a country where hepatitis B is common. Ask your health care provider which countries are considered high risk.  Your parents were born in a high-risk country, and you have not been immunized against hepatitis B (hepatitis B vaccine).  You have HIV or AIDS.  You use needles to inject street drugs.  You live with someone who has hepatitis B.  You have had sex with someone who has hepatitis B.  You get hemodialysis treatment.  You take certain medicines for conditions, including cancer, organ transplantation, and autoimmune conditions. Hepatitis C  Blood testing is recommended for:  Everyone born from [birth year] through 1965.  Anyone with known risk factors for hepatitis C. Sexually transmitted infections (STIs)  You should be screened for sexually transmitted infections (STIs) including gonorrhea and chlamydia if:  You are sexually active and are younger than 44 years of age.  You are older than 44 years of age and your health care provider tells you that you are at risk for this type of infection.  Your sexual activity has changed since you were last screened and you are at an increased risk for chlamydia or gonorrhea. Ask your health care provider if you are at risk.  If you do not have  HIV, but are at risk, it may be recommended that you take a prescription medicine daily to prevent HIV infection. This is called pre-exposure prophylaxis (PrEP). You are considered at risk if:  You are sexually active and do not regularly use condoms or know the HIV status of your partner(s).  You take drugs by injection.  You are sexually active with a  partner who has HIV. Talk with your health care provider about whether you are at high risk of being infected with HIV. If you choose to begin PrEP, you should first be tested for HIV. You should then be tested every 3 months for as long as you are taking PrEP.  PREGNANCY   If you are premenopausal and you may become pregnant, ask your health care provider about preconception counseling.  If you may become pregnant, take 400 to 800 micrograms (mcg) of folic acid every day.  If you want to prevent pregnancy, talk to your health care provider about birth control (contraception). OSTEOPOROSIS AND MENOPAUSE   Osteoporosis is a disease in which the bones lose minerals and strength with aging. This can result in serious bone fractures. Your risk for osteoporosis can be identified using a bone density scan.  If you are 65 years of age or older, or if you are at risk for osteoporosis and fractures, ask your health care provider if you should be screened.  Ask your health care provider whether you should take a calcium or vitamin D supplement to lower your risk for osteoporosis.  Menopause may have certain physical symptoms and risks.  Hormone replacement therapy may reduce some of these symptoms and risks. Talk to your health care provider about whether hormone replacement therapy is right for you.  HOME CARE INSTRUCTIONS   Schedule regular health, dental, and eye exams.  Stay current with your immunizations.   Do not use any tobacco products including cigarettes, chewing tobacco, or electronic cigarettes.  If you are pregnant, do not  drink alcohol.  If you are breastfeeding, limit how much and how often you drink alcohol.  Limit alcohol intake to no more than 1 drink per day for nonpregnant women. One drink equals 12 ounces of beer, 5 ounces of wine, or 1 ounces of hard liquor.  Do not use street drugs.  Do not share needles.  Ask your health care provider for help if you need support or information about quitting drugs.  Tell your health care provider if you often feel depressed.  Tell your health care provider if you have ever been abused or do not feel safe at home. Document Released: 08/11/2010 Document Revised: 06/12/2013 Document Reviewed: 12/28/2012 [employer] Patient Information 2015 [employer]. This information is not intended to replace advice given to you by your health care provider. Make sure you discuss any questions you have with your health care provider.   

## 2014-03-15 NOTE — Progress Notes (Signed)
Subjective:    Patient ID: Leslie Sullivan, female    DOB: [birth year], 44 y.o.   MRN: [id number]  No chief complaint on file.   HPI:  Leslie Sullivan is a 44 y.o. female who presents today for an annual wellness visit.   1) Health Maintenance - Considers fair health but is overweight  Diet - Eats 3-4 meals per day; heavy in the carbohydrates, cheeses, pastas and meats; Occasional fried and processed food. Moderate amounts of caffiene  Exercise - No current structured exercise.   2) Preventative Exams / Immunizations:  Dental -- Up to date  Vision -- Up to date   Health Maintenance  Topic Date Due  . TETANUS/TDAP  05/04/1989  . INFLUENZA VACCINE  05/10/2014 (Originally 09/09/2013)  . PAP SMEAR  03/06/2017  Declines tetanus and flu  There is no immunization history on file for this patient.  No Known Allergies  Current Outpatient Prescriptions on File Prior to Visit  Medication Sig Dispense Refill  . hydrochlorothiazide (HYDRODIURIL) 25 MG tablet Take 1 tablet (25 mg total) by mouth daily. 30 tablet 0   No current facility-administered medications on file prior to visit.    Past Medical History  Diagnosis Date  . Ectopic pregnancy, tubal about 1996, 1998    x2  . Hypertension   . Breast cancer     Left, chemo 09/2010-03/2011, no radiation, bilateral mastectomy  . Chicken pox     Past Surgical History  Procedure Laterality Date  . Mastectomy Bilateral 10/2010       . Laparoscopic assisted vaginal hysterectomy  07/22/2010    pelvic organ prolapse, cystocele, LOA with Transobrurator tape procedure - ovaries remain, tubes previously removed  . Ectopic pregnancy surgery  1995, 1996    tubes removed in 1996  . Breast enhancement surgery Bilateral 05/2011    implants following mastectomy  . Bladder suspension  03/2010    mesh, with hyst in 2012  . Tubal ligation      Family History  Problem Relation Age of Onset  . Lung cancer Maternal Grandmother     smoker  . Diabetes  Paternal Grandmother   . Heart attack Paternal Grandfather     before 59 years old  . Alzheimer's disease Paternal Grandfather   . Thyroid disease Mother   . Thyroid disease Sister     History   Social History  . Marital Status: Married    Spouse Name: N/A    Number of Children: 1  . Years of Education: 12   Occupational History  . Sales Rep    Social History Main Topics  . Smoking status: Never Smoker   . Smokeless tobacco: Never Used  . Alcohol Use: 0.0 oz/week    0 Not specified per week     Comment: socially  . Drug Use: No  . Sexual Activity:    Partners: Male    Museum/gallery curator: , Surgical     Comment: hysterectomy   Other Topics Concern  . Not on file   Social History Narrative   Born and raised in [place]. Fun: Everything   Denies religious beliefs effecting health care.     Review of Systems  Constitutional: Denies fever, chills, fatigue, or significant weight gain/loss. HENT: Head: Denies headache or neck pain Ears: Denies changes in hearing, ringing in ears, earache, drainage Nose: Denies discharge, stuffiness, itching, nosebleed, sinus pain Throat: Denies sore throat, hoarseness, dry mouth, sores, thrush Eyes: Denies loss/changes in vision, pain, redness,  blurry/double vision, flashing lights Cardiovascular: Denies chest pain/discomfort, tightness, palpitations, shortness of breath with activity, difficulty lying down, swelling, sudden awakening with shortness of breath Respiratory: Denies shortness of breath, cough, sputum production, wheezing Gastrointestinal: Denies dysphasia, heartburn, change in appetite, nausea, change in bowel habits, rectal bleeding, constipation, diarrhea, yellow skin or eyes Genitourinary: Denies frequency, urgency, burning/pain, blood in urine, incontinence, change in urinary strength. Musculoskeletal: Denies muscle/joint pain, stiffness, back pain, redness or swelling of joints, trauma Skin: Denies rashes, lumps,  itching, dryness, color changes, or hair/nail changes Neurological: Denies dizziness, fainting, seizures, weakness, numbness, tingling, tremor Psychiatric - Denies nervousness, stress, depression or memory loss Endocrine: Denies heat or cold intolerance, sweating, frequent urination, excessive thirst, changes in appetite Hematologic: Denies ease of bruising or bleeding Breast: Notes previous silicon implants may have ruptured in her right breast and notes small mass under her right breast for which she is being seen by General Surgery.    Objective:    BP 140/86 mmHg  Pulse 60  Temp(Src) 98.4 F (36.9 C) (Oral)  Resp 18  Ht 5\' 7"  (1.702 m)  Wt 214 lb (97.07 kg)  BMI 33.51 kg/m2  SpO2 99%  LMP 04/10/2010 (Approximate) Nursing note and vital signs reviewed.  Physical Exam  Constitutional: She is oriented to person, place, and time. She appears well-developed and well-nourished.  HENT:  Head: Normocephalic.  Right Ear: Hearing, tympanic membrane, external ear and ear canal normal.  Left Ear: Hearing, tympanic membrane, external ear and ear canal normal.  Nose: Nose normal.  Mouth/Throat: Uvula is midline, oropharynx is clear and moist and mucous membranes are normal.  Eyes: Conjunctivae and EOM are normal. Pupils are equal, round, and reactive to light.  Neck: Neck supple. No JVD present. No tracheal deviation present. No thyromegaly present.  Cardiovascular: Normal rate, regular rhythm, normal heart sounds and intact distal pulses.   Pulmonary/Chest: Effort normal and breath sounds normal.    Abdominal: Soft. Bowel sounds are normal. She exhibits no distension and no mass. There is no tenderness. There is no rebound and no guarding.  Musculoskeletal: Normal range of motion. She exhibits no edema or tenderness.  Lymphadenopathy:    She has no cervical adenopathy.  Neurological: She is alert and oriented to person, place, and time. She has normal reflexes. No cranial nerve deficit.  She exhibits normal muscle tone. Coordination normal.  Skin: Skin is warm and dry.  Psychiatric: She has a normal mood and affect. Her behavior is normal. Judgment and thought content normal.       Assessment & Plan:

## 2014-03-15 NOTE — Progress Notes (Signed)
Pre visit review using our clinic review tool, if applicable. No additional management support is needed unless otherwise documented below in the visit note. 

## 2014-03-15 NOTE — Assessment & Plan Note (Addendum)
1) Anticipatory Guidance: Discussed importance of wearing a seatbelt while driving and not texting while driving; changing batteries in smoke detector at least once annually; wearing suntan lotion when outside; eating a balanced and moderate diet; getting physical activity at least 30 minutes per day.  2) Immunizations / Screenings / Labs:  Patient declines tetanus and flu shot. All other immunizations are up-to-date per recommendations. All screenings are up-to-date per recommendations. Obtain CBC, BMET, and lipid profile.  Overall well exam. Discussed risk factors of hypertension and obesity. Hypertension is currently controlled with hydrochlorothiazide. Continue current dosage of hydrochlorothiazide. Patient will work to decrease her body weight by 5-10% following her breast surgery with general surgery. Mass concerning for implant. Already being followed by general surgery. If signs or symptoms of infection appear will treat with Bactrim to cover for MRSA. Follow-up prevention exam in one year. Follow-up for blood pressure in  6 months.

## 2014-03-16 ENCOUNTER — Telehealth: Payer: Self-pay | Admitting: Family

## 2014-03-16 NOTE — Telephone Encounter (Signed)
Left pt a message to call back. 

## 2014-03-16 NOTE — Telephone Encounter (Signed)
Please inform the patient that her lab work has come back normal. Her LDL was slightly elevated at 126 with a goal of less than 100 however there is no need for medication at this time. Otherwise her white blood cell count is normal indicating no active infection at this time.

## 2014-03-19 ENCOUNTER — Other Ambulatory Visit: Payer: Self-pay | Admitting: Nurse Practitioner

## 2014-03-19 NOTE — Telephone Encounter (Signed)
Medication refill request: Keflex 500 mg  Last AEX:  03/06/14 with Ms. Patty Next AEX: No AEX scheduled for 2017 Last MMG (if hormonal medication request): N/A Refill authorized: Please advise.

## 2014-03-20 ENCOUNTER — Encounter: Payer: Self-pay | Admitting: Oncology

## 2014-04-03 ENCOUNTER — Other Ambulatory Visit (HOSPITAL_COMMUNITY): Payer: Self-pay | Admitting: Plastic Surgery

## 2014-04-03 DIAGNOSIS — M7989 Other specified soft tissue disorders: Secondary | ICD-10-CM

## 2014-04-06 ENCOUNTER — Other Ambulatory Visit: Payer: Self-pay | Admitting: Nurse Practitioner

## 2014-06-05 ENCOUNTER — Other Ambulatory Visit: Payer: Managed Care, Other (non HMO)

## 2014-06-12 ENCOUNTER — Other Ambulatory Visit (HOSPITAL_BASED_OUTPATIENT_CLINIC_OR_DEPARTMENT_OTHER): Payer: Managed Care, Other (non HMO)

## 2014-06-12 DIAGNOSIS — C50211 Malignant neoplasm of upper-inner quadrant of right female breast: Secondary | ICD-10-CM

## 2014-06-12 DIAGNOSIS — C50919 Malignant neoplasm of unspecified site of unspecified female breast: Secondary | ICD-10-CM

## 2014-06-12 LAB — CBC WITH DIFFERENTIAL/PLATELET
BASO%: 0.3 % (ref 0.0–2.0)
Basophils Absolute: 0 10*3/uL (ref 0.0–0.1)
EOS%: 1.4 % (ref 0.0–7.0)
Eosinophils Absolute: 0.1 10*3/uL (ref 0.0–0.5)
HEMATOCRIT: 42.8 % (ref 34.8–46.6)
HGB: 14 g/dL (ref 11.6–15.9)
LYMPH%: 24.9 % (ref 14.0–49.7)
MCH: 29.5 pg (ref 25.1–34.0)
MCHC: 32.7 g/dL (ref 31.5–36.0)
MCV: 90.1 fL (ref 79.5–101.0)
MONO#: 0.3 10*3/uL (ref 0.1–0.9)
MONO%: 5.2 % (ref 0.0–14.0)
NEUT%: 68.2 % (ref 38.4–76.8)
NEUTROS ABS: 4.3 10*3/uL (ref 1.5–6.5)
Platelets: 237 10*3/uL (ref 145–400)
RBC: 4.75 10*6/uL (ref 3.70–5.45)
RDW: 14.3 % (ref 11.2–14.5)
WBC: 6.3 10*3/uL (ref 3.9–10.3)
lymph#: 1.6 10*3/uL (ref 0.9–3.3)

## 2014-06-12 LAB — FOLLICLE STIMULATING HORMONE: FSH: 24.8 m[IU]/mL

## 2014-06-12 LAB — COMPREHENSIVE METABOLIC PANEL (CC13)
ALK PHOS: 69 U/L (ref 40–150)
ALT: 22 U/L (ref 0–55)
AST: 15 U/L (ref 5–34)
Albumin: 3.9 g/dL (ref 3.5–5.0)
Anion Gap: 9 mEq/L (ref 3–11)
BUN: 8.8 mg/dL (ref 7.0–26.0)
CO2: 25 mEq/L (ref 22–29)
Calcium: 8.6 mg/dL (ref 8.4–10.4)
Chloride: 105 mEq/L (ref 98–109)
Creatinine: 0.7 mg/dL (ref 0.6–1.1)
EGFR: >90 mL/min/{1.73_m2} (ref >90)
GLUCOSE: 85 mg/dL (ref 70–140)
Potassium: 4.3 mEq/L (ref 3.5–5.1)
Sodium: 139 mEq/L (ref 136–145)
Total Bilirubin: 0.25 mg/dL (ref 0.20–1.20)
Total Protein: 7.1 g/dL (ref 6.4–8.3)

## 2014-06-16 LAB — ESTRADIOL, ULTRA SENS: Estradiol, Ultra Sensitive: 366 pg/mL

## 2014-06-19 ENCOUNTER — Other Ambulatory Visit: Payer: Self-pay | Admitting: Oncology

## 2014-06-19 ENCOUNTER — Ambulatory Visit: Payer: Managed Care, Other (non HMO) | Admitting: Oncology

## 2014-07-17 ENCOUNTER — Ambulatory Visit (HOSPITAL_BASED_OUTPATIENT_CLINIC_OR_DEPARTMENT_OTHER): Payer: Managed Care, Other (non HMO) | Admitting: Oncology

## 2014-07-17 ENCOUNTER — Telehealth: Payer: Self-pay | Admitting: Oncology

## 2014-07-17 VITALS — BP 144/98 | HR 77 | Resp 18 | Ht 67.0 in | Wt 216.6 lb

## 2014-07-17 DIAGNOSIS — Z853 Personal history of malignant neoplasm of breast: Secondary | ICD-10-CM | POA: Diagnosis not present

## 2014-07-17 DIAGNOSIS — C50911 Malignant neoplasm of unspecified site of right female breast: Secondary | ICD-10-CM

## 2014-07-17 NOTE — Progress Notes (Signed)
ID: Leslie Sullivan   DOB: Jan 26, 1971  MR#: [id number]  CSN#:[id number]  PCP: Mauricio Po, FNP GYN:  SU:  OTHER MD:  CHIEF COMPLAINT: Estrogen receptor positive breast cancer  CURRENT TREATMENT: Observation   HISTORY OF PRESENT ILLNESS: From the original intake note:  The patient noted a mass in the right breast which appeared to change with her cycle. Mammography on 09/01/2010, however, at the [facility] in [place] showed a density in the medial aspect of the right breast, confirmed by additional views and ultrasonography, which showed a hypoechoic mass measuring 2.3 cm in the medial right breast. This was biopsied 09/12/2010, and showed (accession number [facility] [id number]) an invasive ductal carcinoma, grade 1, estrogen and progesterone receptor positive (both 3+), with an intermediate Ki-67, and an indeterminate HER-2 at 2+, further evaluated by [facility], which was negative.  The patient underwent bilateral breast MRI 09/19/2010. This confirmed the spiculated mass in the right breast upper inner aspect measuring 2.6 cm. Immediately anterior and medial to this was a small smooth nodule measuring 5 mm which was nonspecific. A third nodule in the lateral right breast measuring 1.7 mm was felt to be likely consistent with a fibroadenoma. In the left breast there were 2 foci of enhancement. 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 With this information and after appropriate discussion, the patient opted for bilateral mastectomies with right sentinel lymph node sampling. This was performed 10/24/2010, and showed ([id number]) on the right side, and invasive ductal carcinoma measuring 2.3 cm, grade 1, with one out of 9 lymph nodes sampled (2 of them being sentinel lymph nodes) positive. The left breast was benign.   Her subsequent history is as  detailed below.  INTERVAL HISTORY: Leslie Sullivan returns today for followup of her breast cancer. At the last visit here she discussed the bilateral ultrasounds obtained at [facility] which suggested bilateral implant rupture. She saw Dr. Iran Planas regarding this and was planning on removal of the implants but got a second opinion for Dr. Harlow Mares and she tells me Dr. Harlow Mares obtain an MRI through the abdomen system or [facility], she is not sure, and at that showed no rupture. Nevertheless she is extremely concerned because there is something under the breast that she really wants me to look at.   REVIEW OF SYSTEMS: Leslie Sullivan just started a very rigorous diet and exercise program. She is already feeling better, she tells me. Aside from questions regarding the implants discussed above, a detailed review of systems today was entirely negative  PAST MEDICAL HISTORY: Past Medical History  Diagnosis Date  . Ectopic pregnancy, tubal about 1996, 1998    x2  . Hypertension   . Breast cancer     Left, chemo 09/2010-03/2011, no radiation, bilateral mastectomy  . Chicken pox     PAST SURGICAL HISTORY: Past Surgical History  Procedure Laterality Date  . Mastectomy Bilateral 10/2010       . Laparoscopic assisted vaginal hysterectomy  07/22/2010    pelvic organ prolapse, cystocele, LOA with Transobrurator tape procedure - ovaries remain, tubes previously removed  . Ectopic pregnancy surgery  1995, 1996    tubes removed in 1996  . Breast enhancement surgery Bilateral 05/2011    implants following mastectomy  . Bladder suspension  03/2010    mesh, with hyst in 2012  . Tubal ligation      FAMILY  HISTORY  (updated September 2013) Family History  Problem Relation Age of Onset  . Lung cancer Maternal Grandmother     smoker  . Diabetes Paternal Grandmother   . Heart attack Paternal Grandfather     before 18 years old  . Alzheimer's disease Paternal Grandfather   . Thyroid disease Mother   . Thyroid disease Sister     The patient's parents are alive, in their 72s. The patient has no brothers, 4 sisters. The patient's mother was diagnosed with cervical cancer at the age of 54. The patient's father was diagnosed with prostate cancer the age of 37. There is no history of breast or ovarian cancer in the immediate family.  GYNECOLOGIC HISTORY: Menarche age 5, first live birth age 8. She is GX P1. The patient underwent hysterectomy in May of 2012. There was no salpingo-oophorectomy. She never used hormone replacement. 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 Her son, Leslie Sullivan,  studies at the [employer]. The patient lives alone, with no pets.   ADVANCED DIRECTIVES: Not in place  HEALTH MAINTENANCE: History  Substance Use Topics  . Smoking status: Never Smoker   . Smokeless tobacco: Never Used  . Alcohol Use: 0.0 oz/week    0 Standard drinks or equivalent per week     Comment: socially     Colonoscopy:  PAP:  Bone density:  Lipid panel:  No Known Allergies  Current Outpatient Prescriptions  Medication Sig Dispense Refill  . hydrochlorothiazide (HYDRODIURIL) 25 MG tablet Take 1 tablet (25 mg total) by mouth daily. 90 tablet 3   No current facility-administered medications for this visit.    OBJECTIVE: Young white woman who appears well Filed Vitals:   07/17/14 1020  BP: 144/98  Pulse: 77  Resp: 18     Body mass index is 33.92 kg/(m^2).    ECOG FS: 0  Sclerae unicteric, pupils round and equal Oropharynx clear and moist-- no thrush or other lesions No cervical or supraclavicular adenopathy Lungs no rales or rhonchi Heart regular rate and rhythm Abd soft, nontender, positive bowel sounds MSK no focal spinal tenderness, no upper extremity lymphedema Neuro: nonfocal, well oriented, appropriate affect Breasts: Status post bilateral mastectomies with bilateral implants in place. I do not palpate any  evidence of recurrent disease. The breasts are pigmented because the patient is going to tanning booth. There is a little bit of erythema and an area where she actually got slightly burnt. The area under the right breast that she feels may be related to the implant is really an asymmetry as compared to the left, but it is just skin muscle and fat I there is nothing to suggest local recurrence. Both axillae are benign.   LAB RESULTS: Results for Leslie Sullivan, Leslie Sullivan (MRN [id number]) as of 07/17/2014 10:02  Ref. Range 02/20/2014 10:20 03/06/2014 11:04 03/06/2014 11:14 03/15/2014 11:29 06/12/2014 08:19  Estradiol, Ultra Sensitive Latest Units: pg/mL     846   Basic Metabolic Panel: No results for input(s): NA, K, CL, CO2, GLUCOSE, BUN, CREATININE, CALCIUM, MG, PHOS in the last 168 hours. GFR CrCl cannot be calculated (Patient has no serum creatinine result on file.). Liver Function Tests: No results for input(s): AST, ALT, ALKPHOS, BILITOT, PROT, ALBUMIN in the last 168 hours. No results for input(s): LIPASE, AMYLASE in the last 168 hours. No results for input(s): AMMONIA in the last 168 hours. Coagulation profile No results for input(s): INR,  PROTIME in the last 168 hours.  CBC: No results for input(s): WBC, NEUTROABS, HGB, HCT, MCV, PLT in the last 168 hours. Cardiac Enzymes: No results for input(s): CKTOTAL, CKMB, CKMBINDEX, TROPONINI in the last 168 hours. BNP: Invalid input(s): POCBNP CBG: No results for input(s): GLUCAP in the last 168 hours. D-Dimer No results for input(s): DDIMER in the last 72 hours. Hgb A1c No results for input(s): HGBA1C in the last 72 hours. Lipid Profile No results for input(s): CHOL, HDL, LDLCALC, TRIG, CHOLHDL, LDLDIRECT in the last 72 hours. Thyroid function studies No results for input(s): TSH, T4TOTAL, T3FREE, THYROIDAB in the last 72 hours.  Invalid input(s): FREET3 Anemia work up No results for input(s): VITAMINB12, FOLATE, FERRITIN, TIBC, IRON, RETICCTPCT in the  last 72 hours. Sepsis Labs Invalid input(s): PROCALCITONIN,  WBC,  LACTICIDVEN Microbiology No results found for this or any previous visit (from the past 240 hour(s)).   No components found for: LABCA125  No results for input(s): INR in the last 168 hours.  Urinalysis    Component Value Date/Time   LABSPEC 1.025 10/05/2011 1934   PHURINE 6.0 10/05/2011 1934   GLUCOSEU NEGATIVE 10/05/2011 1934   HGBUR TRACE* 10/05/2011 1934   BILIRUBINUR neg 03/06/2014 1034   BILIRUBINUR SMALL* 10/05/2011 1934   KETONESUR 40* 10/05/2011 1934   PROTEINUR neg 03/06/2014 1034   PROTEINUR 30* 10/05/2011 1934   UROBILINOGEN negative 03/06/2014 1034   UROBILINOGEN 0.2 10/05/2011 1934   NITRITE neg 03/06/2014 1034   NITRITE NEGATIVE 10/05/2011 1934   LEUKOCYTESUR Negative 03/06/2014 1034    STUDIES: No results found.   ASSESSMENT: 44 y.o. BRCA negative [place] woman  (1) s/p bilateral mastectomies and Right sentinel lymph node sampling 10/24/2010 (with a total of 9 axillary nodes removed) for a Right-sided T2 N1, stage IIB invasive ductal carcinoma, grade 1, strongly estrogen and progesterone receptor positive, HER-2 negative by FISH, with an intermediate Ki-67  (2) s/p cyclophosphamide/ docetaxel x6 completed January 2013  (3) no adjuvant radiation therapy  (4)  The adjuvant! database would quote her a 20% risk of dying from breast cancer within the next 10 years with local treatment (surgery) only. Chemotherapy decreases that risk to about 14%. Optimal antiestrogen therapy can cut residual risk in half, which means she would have a 7% decrease in mortality at 10 years if she took tamoxifen and aromatase inhibitors in some combination.  (5) decided against anti-estrogen therapy  PLAN: Leslie Sullivan looks terrific and soon she will be 4 years out from her definitive surgery. She is still clearly premenopausal. I again told her I wish she would take tamoxifen which would decrease her risk of  recurrence nevertheless clinically she looks terrific.  She is very concerned that her implants may or may not be ruptured and may or may not have migrated. I think it would be a good idea to obtain a CT of the chest just to settle that issue and also it will give [place] a very thorough staging study. This will be done within the next week.  I am also requesting from Dr. Harlow Mares office a report on the MRI which the patient tells me was done in [place]. I cannot axis directly through Epic.  She will see [place] in October. If everything is going well at that point I will start seeing her on a once a year basis-  Leslie Sullivan C    07/17/2014

## 2014-07-17 NOTE — Telephone Encounter (Signed)
Gave avs & calendar for October. °

## 2014-07-24 ENCOUNTER — Ambulatory Visit (HOSPITAL_COMMUNITY)
Admission: RE | Admit: 2014-07-24 | Discharge: 2014-07-24 | Disposition: A | Discharge status: Home / Self Care | Payer: Managed Care, Other (non HMO) | Source: Ambulatory Visit | Attending: Oncology | Admitting: Oncology

## 2014-07-24 ENCOUNTER — Encounter (HOSPITAL_COMMUNITY): Payer: Self-pay

## 2014-07-24 DIAGNOSIS — N63 Unspecified lump in breast: Secondary | ICD-10-CM | POA: Diagnosis not present

## 2014-07-24 DIAGNOSIS — Z9689 Presence of other specified functional implants: Secondary | ICD-10-CM | POA: Diagnosis not present

## 2014-07-24 DIAGNOSIS — Z9882 Breast implant status: Secondary | ICD-10-CM | POA: Diagnosis not present

## 2014-07-24 DIAGNOSIS — C50911 Malignant neoplasm of unspecified site of right female breast: Secondary | ICD-10-CM

## 2014-07-24 DIAGNOSIS — Z853 Personal history of malignant neoplasm of breast: Secondary | ICD-10-CM | POA: Insufficient documentation

## 2014-07-24 MED ORDER — IOHEXOL 300 MG/ML  SOLN
100.0000 mL | Freq: Once | INTRAMUSCULAR | Status: AC | PRN
  Administered 2014-07-24: 80 mL via INTRAVENOUS

## 2014-07-25 ENCOUNTER — Other Ambulatory Visit: Payer: Self-pay | Admitting: Oncology

## 2014-07-25 ENCOUNTER — Other Ambulatory Visit: Payer: Self-pay | Admitting: *Deleted

## 2014-07-26 ENCOUNTER — Other Ambulatory Visit: Payer: Self-pay | Admitting: Oncology

## 2014-07-26 ENCOUNTER — Other Ambulatory Visit: Payer: Self-pay | Admitting: *Deleted

## 2014-07-26 NOTE — Progress Notes (Unsigned)
I called [facility] with the results of her CT scan of the chest, with contrast, obtained 07/24/2014. The radiologists comment that this test is less sensitive than the MRI she already had or evaluation of possible implant rupture, and which did not show implant rupture. The CT scan also does not show any evidence of intra-or extracapsular rupture. There are probable radial folds bilaterally and that may be what was seen on the ultrasound obtained at [facility] in January.  There was a small soft tissue nodule between the right breast implant and the skin measuring 1.4 mm. This was felt to be an indeterminate finding by CT scan, and again they felt MRI, which she has already had, is more sensitive. I also reviewed the ultrasound at [facility] from January, and it states "the superficial soft tissue mass on the right inferior breast area was evaluated and showed skin thickening but no mass borders per se. No specific sonographic findings identify this a silicone extravasation. Findings are not suggestive of metastatic change and are not inconsistent with reactionary changed to silicone extravasation".  I am copying both Dr. Harlow Mares and Dr. Marcelo Baldy with this note. We will see [place] again in October and we will continue to follow the small abnormality under the right breast clinically.  Addendum: Obtained a copy of the bilateral breast MRI with and without contrast performed at [facility] 04/09/2014 on [place]. There is no evidence of intra-or extracapsular rupture. There are no masses or suspicious enhancement in either breast. There is no axillary or internal adenopathy.

## 2014-07-31 ENCOUNTER — Emergency Department (INDEPENDENT_AMBULATORY_CARE_PROVIDER_SITE_OTHER)
Admission: EM | Admit: 2014-07-31 | Discharge: 2014-07-31 | Disposition: A | Discharge status: Home / Self Care | Payer: Managed Care, Other (non HMO) | Source: Home / Self Care | Attending: Family Medicine | Admitting: Family Medicine

## 2014-07-31 ENCOUNTER — Encounter (HOSPITAL_COMMUNITY): Payer: Self-pay | Admitting: Emergency Medicine

## 2014-07-31 DIAGNOSIS — L509 Urticaria, unspecified: Secondary | ICD-10-CM | POA: Diagnosis not present

## 2014-07-31 MED ORDER — TRIAMCINOLONE ACETONIDE 40 MG/ML IJ SUSP
INTRAMUSCULAR | Status: AC
  Filled 2014-07-31: qty 1

## 2014-07-31 MED ORDER — TRIAMCINOLONE ACETONIDE 40 MG/ML IJ SUSP
40.0000 mg | Freq: Once | INTRAMUSCULAR | Status: AC
  Administered 2014-07-31: 40 mg via INTRAMUSCULAR

## 2014-07-31 MED ORDER — PREDNISONE 20 MG PO TABS: ORAL_TABLET | ORAL | Status: DC

## 2014-07-31 NOTE — Discharge Instructions (Signed)
Hives Injection of Kenalog now Tomorrow start the Prednisone taper dose. If makes you irritable can stop it or cut the dose down. Take Zantac 150 mg twice a day. Antihistamine: You can take Benadryl 25 to 50 mg every 4 to 6 hours as needed OR if too drowsy can take your choice of Zyrtec or Claritin or Allegra once daily in place of the Benadryl. So..... You will be taking 3 medicines, Prednisone, Zantac and one of the above antihistamines. Hives are itchy, red, swollen areas of the skin. They can vary in size and location on your body. Hives can come and go for hours or several days (acute hives) or for several weeks (chronic hives). Hives do not spread from person to person (noncontagious). They may get worse with scratching, exercise, and emotional stress. CAUSES   Allergic reaction to food, additives, or drugs.  Infections, including the common cold.  Illness, such as vasculitis, lupus, or thyroid disease.  Exposure to sunlight, heat, or cold.  Exercise.  Stress.  Contact with chemicals. SYMPTOMS   Red or white swollen patches on the skin. The patches may change size, shape, and location quickly and repeatedly.  Itching.  Swelling of the hands, feet, and face. This may occur if hives develop deeper in the skin. DIAGNOSIS  Your caregiver can usually tell what is wrong by performing a physical exam. Skin or blood tests may also be done to determine the cause of your hives. In some cases, the cause cannot be determined. TREATMENT  Mild cases usually get better with medicines such as antihistamines. Severe cases may require an emergency epinephrine injection. If the cause of your hives is known, treatment includes avoiding that trigger.  HOME CARE INSTRUCTIONS   Avoid causes that trigger your hives.  Take antihistamines as directed by your caregiver to reduce the severity of your hives. Non-sedating or low-sedating antihistamines are usually recommended. Do not drive while taking  an antihistamine.  Take any other medicines prescribed for itching as directed by your caregiver.  Wear loose-fitting clothing.  Keep all follow-up appointments as directed by your caregiver. SEEK MEDICAL CARE IF:   You have persistent or severe itching that is not relieved with medicine.  You have painful or swollen joints. SEEK IMMEDIATE MEDICAL CARE IF:   You have a fever.  Your tongue or lips are swollen.  You have trouble breathing or swallowing.  You feel tightness in the throat or chest.  You have abdominal pain. These problems may be the first sign of a life-threatening allergic reaction. Call your local emergency services (911 in [place] MAKE SURE YOU:   Understand these instructions.  Will watch your condition.  Will get help right away if you are not doing well or get worse. Document Released: 01/26/2005 Document Revised: 01/31/2013 Document Reviewed: 04/21/2011 [facility] Patient Information 2015 [place], [place]. This information is not intended to replace advice given to you by your health care provider. Make sure you discuss any questions you have with your health care provider.  Seafood Allergy  Seafood allergies are usually a life-long problem. People are usually only allergic to one seafood group. Seafood allergy does not increase the risk of iodine allergy. Some conditions (scombroid fish poisoning and [place] allergy) may seem like allergic reactions to seafood, but are separate conditions. Bad reactions may also occur after eating seafood infected or tainted by algae-derived neurotoxins (ciguatera and paralytic shellfish poisoning). SYMPTOMS  Many allergic reactions to food are mild. Mild symptoms may be limited to hives  or swelling in one area. The most dangerous symptoms are:  Breathing difficulties. This may occur from breathing in seafood allergen fumes when food is being cooked or in seafood processing factories.  A drop in blood pressure  (shock).  Anaphylaxis is a severe whole body reaction. This is the most severe form of allergic reaction. Other symptoms include:   Swelling of the face or throat.  Dizziness.  Difficulty thinking.  Intense sense of fear.  Tightness in the chest.  Vomiting.  Diarrhea. TYPES OF SEAFOOD There are many types of seafood. The major groups of sea life that trigger allergic reactions are:  VERTEBRATES  Scaly fish (salmon, cod, mackerel, sardines, herring, anchovies, tuna, trout, haddock, [place]).  INVERTEBRATES  Crustaceans (prawns/shrimps, lobster, crab, crayfish, yabbies).  Mollusks.  Shellfish (clams, mussels, oysters, scallops).  Cephalopods (octopus, cuttlefish, squid, calamari).  Gastropods (sea slugs, garden slugs, snails). As a rule, patients allergic to one group of seafood can usually tolerate those from another. Seafood allergy is most common in communities where seafood is an important part of the diet, such as [place] and [place]. Sensitivity is more common in adults than children.  Occasionally, intense cooking will partially or completely destroy the triggering allergen. This may explain why some patients allergic to fresh fish are able to tolerate salmon or tuna in a can. AVOIDING THE ALLERGEN IS AN IMPORTANT PART OF MANAGEMENT. Complete avoidance of one or more groups of seafood is often advised. It may be difficult to achieve in practice. Accidental exposure is more likely to occur when eating away from home. This is most true when eating at seafood restaurants. OTHER POTENTIAL SOURCES OF ACCIDENTAL EXPOSURE AND CROSS-CONTAMINATION INCLUDE:  Seafood platters (best avoided).  Asian foods in which shellfish can be a common ingredient or contaminant (prawns in fried rice or soups).  Food may be rolled in the same batter or cooked in the same oil as seafood (take-out fish and chips).  Anchovies (fish) in Caesar salads and as an ingredient, or Worcestershire  sauce.  Contaminated barbecues.  Fish extracts are also occasionally used to remove particulate matter from some beverages such as wine and beer. This process is called "fining." SEAFOOD ALLERGY AND IODINE ALLERGY ARE UNRELATED. Even though seafood is a rich source of natural iodine, allergic reactions to seafood proteins have a different mechanism to that of iodine. Iodine can be found in topical antiseptics and x-ray contrast agents. Patients allergic to seafood are not at an increased risk of allergic reactions to iodine. Those with iodine allergy are not at increased risk of seafood allergy. SEEK IMMEDIATE MEDICAL CARE IF:  You have difficulty breathing, or you are wheezing or have a tight feeling in your chest or throat.  You have a swollen mouth, or have hives, swelling or itching over your body.  You feel faint or pass out.  You develop chest pain or a worsening of the problems which originally caused you to seek medical help. If you have eaten seafood and develop problems or symptoms that seem unusual for you, seek advice from your caregiver. If the problems are severe, call your local emergency medical service. Document Released: 07/18/2001 Document Revised: 04/20/2011 Document Reviewed: 05/12/2013 [facility] Patient Information 2015 [place], [place]. This information is not intended to replace advice given to you by your health care provider. Make sure you discuss any questions you have with your health care provider.

## 2014-07-31 NOTE — ED Notes (Signed)
Pt states that she started to break out in hives yesterday.. She states that she is not sure what caused this breakout.

## 2014-07-31 NOTE — ED Provider Notes (Signed)
CSN: [id number]     Arrival date & time 07/31/14  1832 History   First MD Initiated Contact with Patient 07/31/14 1912     No chief complaint on file.  (Consider location/radiation/quality/duration/timing/severity/associated sxs/prior Treatment) HPI Comments: 44 year old female developed urticarial type rash last PM. She has been taking Benadryl with modest relief. She is unsure as to what may cause this. She is on no new medications. She did eat shrimp tonight's before but she has eaten this before without any type or reaction. Denies systemic symptoms, swelling of any part of the body or trouble breathing.   Past Medical History  Diagnosis Date  . Ectopic pregnancy, tubal about 1996, 1998    x2  . Hypertension   . Breast cancer     Left, chemo 09/2010-03/2011, no radiation, bilateral mastectomy  . Chicken pox    Past Surgical History  Procedure Laterality Date  . Mastectomy Bilateral 10/2010       . Laparoscopic assisted vaginal hysterectomy  07/22/2010    pelvic organ prolapse, cystocele, LOA with Transobrurator tape procedure - ovaries remain, tubes previously removed  . Ectopic pregnancy surgery  1995, 1996    tubes removed in 1996  . Breast enhancement surgery Bilateral 05/2011    implants following mastectomy  . Bladder suspension  03/2010    mesh, with hyst in 2012  . Tubal ligation     Family History  Problem Relation Age of Onset  . Lung cancer Maternal Grandmother     smoker  . Diabetes Paternal Grandmother   . Heart attack Paternal Grandfather     before 44 years old  . Alzheimer's disease Paternal Grandfather   . Thyroid disease Mother   . Thyroid disease Sister    History  Substance Use Topics  . Smoking status: Never Smoker   . Smokeless tobacco: Never Used  . Alcohol Use: 0.0 oz/week    0 Standard drinks or equivalent per week     Comment: socially   OB History    Gravida Para Term Preterm AB TAB SAB Ectopic Multiple Living   4 1 1  3   2  1       Review of Systems  Constitutional: Negative for fever, activity change and fatigue.  HENT: Negative.   Respiratory: Negative for cough, shortness of breath and wheezing.   Cardiovascular: Negative.   Gastrointestinal: Negative.   Musculoskeletal: Negative.   Skin: Positive for rash.  Neurological: Negative.     Allergies  Review of patient's allergies indicates no known allergies.  Home Medications   Prior to Admission medications   Medication Sig Start Date End Date Taking? Authorizing Provider  hydrochlorothiazide (HYDRODIURIL) 25 MG tablet Take 1 tablet (25 mg total) by mouth daily. 03/15/14   Golden Circle, FNP   BP 147/93 mmHg  Pulse 74  Temp(Src) 98.4 F (36.9 C) (Oral)  Resp 12  SpO2 100%  LMP 04/10/2010 (Approximate) Physical Exam  Constitutional: She is oriented to person, place, and time. She appears well-developed and well-nourished. No distress.  HENT:  Mouth/Throat: Oropharynx is clear and moist.  No intraoral swelling or erythema. Airway is widely patent. Uvula and tongue midline. No stridor or other abnormal airway sounds.  Eyes: Conjunctivae and EOM are normal.  Neck: Normal range of motion. Neck supple.  Cardiovascular: Normal rate, regular rhythm and normal heart sounds.   Pulmonary/Chest: Effort normal and breath sounds normal. No respiratory distress. She has no wheezes. She has no rales.  Musculoskeletal: She exhibits  no edema.  Lymphadenopathy:    She has no cervical adenopathy.  Neurological: She is alert and oriented to person, place, and time. She exhibits normal muscle tone.  Skin: Skin is warm. Rash noted.  Primarily cutaneous patches of erythema and scattered about the extremities and lesser to the chest. Erythema may occurs in areas in which she scratches.  Psychiatric: She has a normal mood and affect.  Nursing note and vitals reviewed.   ED Course  Procedures (including critical care time) Labs Review Labs Reviewed - No data to  display  Imaging Review No results found.   MDM  No diagnosis found.  Injection of Kenalog 40 mg IM now Tomorrow start the Prednisone taper dose. If makes you irritable can stop it or cut the dose down. Take Zantac 150 mg twice a day. Antihistamine: You can take Benadryl 25 to 50 mg every 4 to 6 hours as needed OR if too drowsy can take your choice of Zyrtec or Claritin or Allegra once daily in place of the Benadryl. So..... You will be taking 3 medicines, Prednisone, Zantac and one of the above antihistamines.     Leslie Napoleon, NP 07/31/14 [phone]

## 2014-08-03 ENCOUNTER — Telehealth: Payer: Self-pay | Admitting: *Deleted

## 2014-08-03 NOTE — Telephone Encounter (Signed)
She already had an MRi, this was reviewed w Dr Harlow Mares, there is no evidence of implant rupture  Thanks!

## 2014-08-03 NOTE — Telephone Encounter (Signed)
Called patient. Voicemail identified as [employer].  Message left with Dr. Virgie Dad response to patient call.

## 2014-08-03 NOTE — Telephone Encounter (Signed)
Patient called reporting "I have not heard anything about MRI Dr. Jana Hakim wanted scheduled.  I had a CT that showed something that needed further evaluation."   No MRI orders observed.  Will notify Dr. Jana Hakim.  Observed the following results.    Recent CT Chest IMPRESSION: 1. No definite CT findings for implant rupture. Radial folds are noted bilaterally. The right breast prosthesis is positioned slightly higher in the chest wall than the left and the contour is slightly wavy. Recommend correlation with recent MRI examination. 2. One small soft tissue nodule between the right breast implant and the skin. This is an indeterminate finding. Recommend correlation with recent MRI and ultrasound if available. 3. No supraclavicular, axillary, mediastinal or hilar lymphadenopathy. 4. Normal lungs. 5. No worrisome bone lesions. 6. 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

## 2014-10-05 ENCOUNTER — Telehealth: Payer: Self-pay | Admitting: *Deleted

## 2014-10-05 ENCOUNTER — Other Ambulatory Visit: Payer: Self-pay | Admitting: *Deleted

## 2014-10-05 ENCOUNTER — Ambulatory Visit: Payer: Managed Care, Other (non HMO) | Admitting: Family

## 2014-10-05 MED ORDER — METAXALONE 400 MG PO TABS
ORAL_TABLET | ORAL | Status: DC
Start: 2014-10-05 — End: 2014-10-16

## 2014-10-05 NOTE — Telephone Encounter (Signed)
This RN spoke with pt and MD - per discussion MD gave order for skelaxin.  Note per pt she did contact her primary MD who stated he needed to see her prior to giving a medication- " problem is I am not physical able to come in to his office "  Pt has only been seen once by primary MD- " and he said he didn't know me and is not comfortable prescribing with out seeing me in the office."

## 2014-10-05 NOTE — Telephone Encounter (Signed)
COULD DR.MAGRINAT PRESCRIBE SOME MEDICATION?

## 2014-10-05 NOTE — Telephone Encounter (Signed)
See other entry 

## 2014-10-16 ENCOUNTER — Encounter: Payer: Self-pay | Admitting: Internal Medicine

## 2014-10-16 ENCOUNTER — Ambulatory Visit (INDEPENDENT_AMBULATORY_CARE_PROVIDER_SITE_OTHER): Payer: Managed Care, Other (non HMO) | Admitting: Internal Medicine

## 2014-10-16 ENCOUNTER — Ambulatory Visit (HOSPITAL_COMMUNITY)
Admission: RE | Admit: 2014-10-16 | Discharge: 2014-10-16 | Disposition: A | Discharge status: Home / Self Care | Payer: Managed Care, Other (non HMO) | Source: Ambulatory Visit | Attending: Internal Medicine | Admitting: Internal Medicine

## 2014-10-16 VITALS — BP 136/88 | HR 79 | Temp 97.9°F | Ht 66.0 in | Wt 209.0 lb

## 2014-10-16 DIAGNOSIS — M5416 Radiculopathy, lumbar region: Secondary | ICD-10-CM

## 2014-10-16 DIAGNOSIS — M5126 Other intervertebral disc displacement, lumbar region: Secondary | ICD-10-CM | POA: Insufficient documentation

## 2014-10-16 MED ORDER — HYDROCODONE-ACETAMINOPHEN 7.5-325 MG PO TABS: 1.0000 | ORAL_TABLET | Freq: Four times a day (QID) | ORAL | Status: DC | PRN

## 2014-10-16 MED ORDER — METAXALONE 400 MG PO TABS: ORAL_TABLET | ORAL | Status: DC

## 2014-10-16 NOTE — Assessment & Plan Note (Addendum)
New onset only 2 wks, but with neuro changes and severe pain, high suspicion for herniated disc with nerve involement; for LS Spine MRI, and refer ortho, also for pain control

## 2014-10-16 NOTE — Progress Notes (Signed)
Subjective:    Patient ID: Leslie Sullivan, female    DOB: 1970/08/12, 44 y.o.   MRN: [id number]  HPI  Here with acute onset 2 wks lower back pain without obvious inciting event; severe, constant,  Better to be standing and walking not necessarily worse, but sitting worse (lying worse but better than sitting), and bending worse; wears flip flops and dress so as not wear pants or lace up shoes. Bending and sitting makes worse with raidation to the right buttock and occas to knww;  bowel or bladder change, fever, wt loss,  worsening LE pain/numbness/weakness, gait change or falls (except one fall with bending in the shower).  Stiffness and pain worse in the AM with first movements or sitting for prolonged time. Past Medical History  Diagnosis Date  . Ectopic pregnancy, tubal about 1996, 1998    x2  . Hypertension   . Breast cancer     Left, chemo 09/2010-03/2011, no radiation, bilateral mastectomy  . Chicken pox    Past Surgical History  Procedure Laterality Date  . Mastectomy Bilateral 10/2010       . Laparoscopic assisted vaginal hysterectomy  07/22/2010    pelvic organ prolapse, cystocele, LOA with Transobrurator tape procedure - ovaries remain, tubes previously removed  . Ectopic pregnancy surgery  1995, 1996    tubes removed in 1996  . Breast enhancement surgery Bilateral 05/2011    implants following mastectomy  . Bladder suspension  03/2010    mesh, with hyst in 2012  . Tubal ligation      reports that she has never smoked. She has never used smokeless tobacco. She reports that she drinks alcohol. She reports that she does not use illicit drugs. family history includes Alzheimer's disease in her paternal grandfather; Diabetes in her paternal grandmother; Heart attack in her paternal grandfather; Lung cancer in her maternal grandmother; Thyroid disease in her mother and sister. No Known Allergies Current Outpatient Prescriptions on File Prior to Visit  Medication Sig Dispense Refill  .  hydrochlorothiazide (HYDRODIURIL) 25 MG tablet Take 1 tablet (25 mg total) by mouth daily. (Patient not taking: Reported on 10/16/2014) 90 tablet 3  . metaxalone (SKELAXIN) 400 MG tablet 1-2 po q 8 hours prn back spasms (Patient not taking: Reported on 10/16/2014) 40 tablet 0  . predniSONE (DELTASONE) 20 MG tablet Take 3 tabs po on first day, 2 tabs second day, 2 tabs third day, 1 tab fourth day, 1 tab 5th day. Take with food. Start 08/01/2014. (Patient not taking: Reported on 10/16/2014) 9 tablet 0   No current facility-administered medications on file prior to visit.   Review of Systems   Constitutional: Negative for unusual diaphoresis or night sweats HENT: Negative for ringing in ear or discharge Eyes: Negative for double vision or worsening visual disturbance.  Respiratory: Negative for choking and stridor.   Gastrointestinal: Negative for vomiting or other signifcant bowel change Genitourinary: Negative for hematuria or change in urine volume.  Musculoskeletal: Negative for other MSK pain or swelling Skin: Negative for color change and worsening wound.  Neurological: Negative for tremors and numbness other than noted  Psychiatric/Behavioral: Negative for decreased concentration or agitation other than above       Objective:   Physical Exam BP 136/88 mmHg  Pulse 79  Temp(Src) 97.9 F (36.6 C) (Oral)  Ht 5\' 6"  (1.676 m)  Wt 209 lb (94.802 kg)  BMI 33.75 kg/m2  SpO2 97%  LMP 04/10/2010 (Approximate) VS noted, cannot sit well due to  severe pain Constitutional: Pt appears in no significant distress HENT: Head: NCAT.  Right Ear: External ear normal.  Left Ear: External ear normal.  Eyes: . Pupils are equal, round, and reactive to light. Conjunctivae and EOM are normal Neck: Normal range of motion. Neck supple.  Cardiovascular: Normal rate and regular rhythm.   Pulmonary/Chest: Effort normal and breath sounds without rales or wheezing.  Abd:  Soft, NT, ND, + Spine nontender, has some  mild right paravertebral spasm/tender  Neurological: Pt is alert. Not confused , motor limited exam due to pain, but 4/5 RLE diffusely with diminished patellar and achilles reflexes - LLE normal motor/dtr., sens normal bilat Skin: Skin is warm. No rash, no LE edema Psychiatric: Pt behavior is normal. No agitation.     Assessment & Plan:

## 2014-10-16 NOTE — Patient Instructions (Signed)
Please take all new medication as prescribed - the hydrocodone for pain  Please continue all other medications as before, and refills have been done if requested - the skelaxin  Please have the pharmacy call with any other refills you may need.  Please keep your appointments with your specialists as you may have planned  You will be contacted regarding the referral for: MRI and orthopedic (to see PCC;s now)

## 2014-10-16 NOTE — Progress Notes (Signed)
Pre visit review using our clinic review tool, if applicable. No additional management support is needed unless otherwise documented below in the visit note. 

## 2014-10-17 ENCOUNTER — Telehealth: Payer: Self-pay | Admitting: Family

## 2014-10-17 NOTE — Telephone Encounter (Signed)
Pt called in and would like to see if MRI results are in that Dr Jenny Reichmann ordered?  She would like someone call her with results when they are in

## 2014-10-18 NOTE — Telephone Encounter (Signed)
Pt advised via VM 

## 2014-11-14 ENCOUNTER — Ambulatory Visit: Payer: Managed Care, Other (non HMO) | Admitting: Nurse Practitioner

## 2014-11-16 ENCOUNTER — Encounter: Payer: Self-pay | Admitting: Nurse Practitioner

## 2014-11-16 ENCOUNTER — Telehealth: Payer: Self-pay | Admitting: Nurse Practitioner

## 2014-11-16 ENCOUNTER — Ambulatory Visit (HOSPITAL_BASED_OUTPATIENT_CLINIC_OR_DEPARTMENT_OTHER): Payer: Managed Care, Other (non HMO) | Admitting: Nurse Practitioner

## 2014-11-16 VITALS — BP 141/80 | HR 76 | Temp 98.6°F | Resp 18 | Ht 66.0 in | Wt 213.4 lb

## 2014-11-16 DIAGNOSIS — Z853 Personal history of malignant neoplasm of breast: Secondary | ICD-10-CM

## 2014-11-16 DIAGNOSIS — C50911 Malignant neoplasm of unspecified site of right female breast: Secondary | ICD-10-CM

## 2014-11-16 NOTE — Telephone Encounter (Signed)
Appointments made and avs mailed to pateint

## 2014-11-16 NOTE — Progress Notes (Signed)
ID: Leslie Sullivan   DOB: 03-08-1970  MR#: [id number]  DZH#:[id number]  PCP: Mauricio Po, FNP GYN:  SU:  OTHER MD:  CHIEF COMPLAINT: Estrogen receptor positive breast cancer  CURRENT TREATMENT: Observation  BREAST CANCER HISTORY: From the original intake note:  The patient noted a mass in the right breast which appeared to change with her cycle. Mammography on 09/01/2010, however, at the [facility] in [place] showed a density in the medial aspect of the right breast, confirmed by additional views and ultrasonography, which showed a hypoechoic mass measuring 2.3 cm in the medial right breast. This was biopsied 09/12/2010, and showed (accession number [facility] [id number]) an invasive ductal carcinoma, grade 1, estrogen and progesterone receptor positive (both 3+), with an intermediate Ki-67, and an indeterminate HER-2 at 2+, further evaluated by [facility], which was negative.  The patient underwent bilateral breast MRI 09/19/2010. This confirmed the spiculated mass in the right breast upper inner aspect measuring 2.6 cm. Immediately anterior and medial to this was a small smooth nodule measuring 5 mm which was nonspecific. A third nodule in the lateral right breast measuring 1.7 mm was felt to be likely consistent with a fibroadenoma. In the left breast there were 2 foci of enhancement. 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 With this information and after appropriate discussion, the patient opted for bilateral mastectomies with right sentinel lymph node sampling. This was performed 10/24/2010, and showed ([id number]) on the right side, and invasive ductal carcinoma measuring 2.3 cm, grade 1, with one out of 9 lymph nodes sampled (2 of them being sentinel lymph nodes) positive. The left breast was benign.   Her subsequent history is as detailed  below.  INTERVAL HISTORY: Leslie Sullivan returns today for follow up of her breast cancer. She has found a primary care physician and is well established in [place] now. Unfortunately she has fallen off her diet and exercise regimen. She is also inconsistent with her blood pressure medicine, and has seen an increase there. She has some lower back pain, but is working with a Restaurant manager, fast food and is improving in this area.   REVIEW OF SYSTEMS: A detailed review of systems is otherwise entirely negative, except where noted above.   PAST MEDICAL HISTORY: Past Medical History  Diagnosis Date  . Ectopic pregnancy, tubal about 1996, 1998    x2  . Hypertension   . Breast cancer     Left, chemo 09/2010-03/2011, no radiation, bilateral mastectomy  . Chicken pox     PAST SURGICAL HISTORY: Past Surgical History  Procedure Laterality Date  . Mastectomy Bilateral 10/2010       . Laparoscopic assisted vaginal hysterectomy  07/22/2010    pelvic organ prolapse, cystocele, LOA with Transobrurator tape procedure - ovaries remain, tubes previously removed  . Ectopic pregnancy surgery  1995, 1996    tubes removed in 1996  . Breast enhancement surgery Bilateral 05/2011    implants following mastectomy  . Bladder suspension  03/2010    mesh, with hyst in 2012  . Tubal ligation      FAMILY HISTORY  (updated September 2013) Family History  Problem Relation Age of Onset  . Lung cancer Maternal Grandmother     smoker  . Diabetes Paternal Grandmother   . Heart attack Paternal Grandfather     before 15 years old  . Alzheimer's disease Paternal Grandfather   .  Thyroid disease Mother   . Thyroid disease Sister    The patient's parents are alive, in their 55s. The patient has no brothers, 4 sisters. The patient's mother was diagnosed with cervical cancer at the age of 36. The patient's father was diagnosed with prostate cancer the age of 29. There is no history of breast or ovarian cancer in the immediate  family.  GYNECOLOGIC HISTORY: Menarche age 35, first live birth age 58. She is GX P1. The patient underwent hysterectomy in May of 2012. There was no salpingo-oophorectomy. She never used hormone replacement. She used birth control pills remotely without event  SOCIAL HISTORY: Mistey works as an Conservation officer, nature for [employer]. Her son, Berta Minor,  studies at the [employer]. The patient lives alone, with no pets.   ADVANCED DIRECTIVES: Not in place  HEALTH MAINTENANCE: Social History  Substance Use Topics  . Smoking status: Never Smoker   . Smokeless tobacco: Never Used  . Alcohol Use: 0.0 oz/week    0 Standard drinks or equivalent per week     Comment: socially     Colonoscopy:  PAP:  Bone density:  Lipid panel:  No Known Allergies  Current Outpatient Prescriptions  Medication Sig Dispense Refill  . hydrochlorothiazide (HYDRODIURIL) 12.5 MG tablet Take 12.5 mg by mouth daily.     No current facility-administered medications for this visit.    OBJECTIVE: Young white woman who appears well Filed Vitals:   11/16/14 0853  BP: 141/80  Pulse: 76  Temp: 98.6 F (37 C)  Resp: 18     Body mass index is 34.46 kg/(m^2).    ECOG FS: 0  Skin: warm, dry  HEENT: sclerae anicteric, conjunctivae pink, oropharynx clear. No thrush or mucositis.  Lymph Nodes: No cervical or supraclavicular lymphadenopathy  Lungs: clear to auscultation bilaterally, no rales, wheezes, or rhonci  Heart: regular rate and rhythm  Abdomen: round, soft, non tender, positive bowel sounds  Musculoskeletal: No focal spinal tenderness, no peripheral edema  Neuro: non focal, well oriented, positive affect  Breasts: bilateral breasts status post bilateral mastectomies and implants. No evidence of recurrent disease. Bilateral axillae benign.    LAB RESULTS: Results for KAMILLE, TOOMEY (MRN [id number]) as of 07/17/2014 10:02  Ref. Range 02/20/2014 10:20 03/06/2014 11:04 03/06/2014 11:14 03/15/2014 11:29 06/12/2014  08:19  Estradiol, Ultra Sensitive Latest Units: pg/mL     196   Basic Metabolic Panel: No results for input(s): NA, K, CL, CO2, GLUCOSE, BUN, CREATININE, CALCIUM, MG, PHOS in the last 168 hours. GFR CrCl cannot be calculated (Patient has no serum creatinine result on file.). Liver Function Tests: No results for input(s): AST, ALT, ALKPHOS, BILITOT, PROT, ALBUMIN in the last 168 hours. No results for input(s): LIPASE, AMYLASE in the last 168 hours. No results for input(s): AMMONIA in the last 168 hours. Coagulation profile No results for input(s): INR, PROTIME in the last 168 hours.  CBC: No results for input(s): WBC, NEUTROABS, HGB, HCT, MCV, PLT in the last 168 hours. Cardiac Enzymes: No results for input(s): CKTOTAL, CKMB, CKMBINDEX, TROPONINI in the last 168 hours. BNP: Invalid input(s): POCBNP CBG: No results for input(s): GLUCAP in the last 168 hours. D-Dimer No results for input(s): DDIMER in the last 72 hours. Hgb A1c No results for input(s): HGBA1C in the last 72 hours. Lipid Profile No results for input(s): CHOL, HDL, LDLCALC, TRIG, CHOLHDL, LDLDIRECT in the last 72 hours. Thyroid function studies No results for input(s): TSH, T4TOTAL, T3FREE, THYROIDAB in the last  72 hours.  Invalid input(s): FREET3 Anemia work up No results for input(s): VITAMINB12, FOLATE, FERRITIN, TIBC, IRON, RETICCTPCT in the last 72 hours. Sepsis Labs Invalid input(s): PROCALCITONIN,  WBC,  LACTICIDVEN Microbiology No results found for this or any previous visit (from the past 240 hour(s)).   No components found for: LABCA125  No results for input(s): INR in the last 168 hours.  Urinalysis    Component Value Date/Time   LABSPEC 1.025 10/05/2011 1934   PHURINE 6.0 10/05/2011 1934   GLUCOSEU NEGATIVE 10/05/2011 1934   HGBUR TRACE* 10/05/2011 1934   BILIRUBINUR neg 03/06/2014 1034   BILIRUBINUR SMALL* 10/05/2011 1934   KETONESUR 40* 10/05/2011 1934   PROTEINUR neg 03/06/2014 1034    PROTEINUR 30* 10/05/2011 1934   UROBILINOGEN negative 03/06/2014 1034   UROBILINOGEN 0.2 10/05/2011 1934   NITRITE neg 03/06/2014 1034   NITRITE NEGATIVE 10/05/2011 1934   LEUKOCYTESUR Negative 03/06/2014 1034    STUDIES: No results found.   ASSESSMENT: 44 y.o. BRCA negative [place] woman  (1) s/p bilateral mastectomies and Right sentinel lymph node sampling 10/24/2010 (with a total of 9 axillary nodes removed) for a Right-sided T2 N1, stage IIB invasive ductal carcinoma, grade 1, strongly estrogen and progesterone receptor positive, HER-2 negative by FISH, with an intermediate Ki-67  (2) s/p cyclophosphamide/ docetaxel x6 completed January 2013  (3) no adjuvant radiation therapy  (4)  The adjuvant! database would quote her a 20% risk of dying from breast cancer within the next 10 years with local treatment (surgery) only. Chemotherapy decreases that risk to about 14%. Optimal antiestrogen therapy can cut residual risk in half, which means she would have a 7% decrease in mortality at 10 years if she took tamoxifen and aromatase inhibitors in some combination.  (5) decided against anti-estrogen therapy  PLAN: Ellieana is doing well as far as her breast cancer is concerned. She is now 4 years out from her definitive surgery with no evidence of recurrent disease. Again we discussed antiestrogen therapy, but she still declines.   We discussed the importance of taking her blood pressure medicines as prescribed. I also encouraged her to get back into an exercise routine.   Larra will return in 1 year for labs and a follow up visit. She understands and agrees with this plan. She has been encouraged to call with any issues that might arise before her next visit here.   Genelle Gather Tawnia Schirm    11/16/2014

## 2015-11-04 ENCOUNTER — Telehealth: Payer: Self-pay | Admitting: Oncology

## 2015-11-04 NOTE — Telephone Encounter (Signed)
09/25 Appointment rescheduled to 10/17 per patient request.

## 2015-11-18 ENCOUNTER — Other Ambulatory Visit: Payer: Managed Care, Other (non HMO)

## 2015-11-18 ENCOUNTER — Ambulatory Visit: Payer: Managed Care, Other (non HMO) | Admitting: Oncology

## 2015-11-25 NOTE — Progress Notes (Signed)
No show

## 2015-11-26 ENCOUNTER — Other Ambulatory Visit: Payer: Managed Care, Other (non HMO)

## 2015-11-26 ENCOUNTER — Other Ambulatory Visit: Payer: Self-pay | Admitting: *Deleted

## 2015-11-26 ENCOUNTER — Encounter: Payer: Self-pay | Admitting: Oncology

## 2015-11-26 ENCOUNTER — Other Ambulatory Visit: Payer: Self-pay | Admitting: Oncology

## 2015-11-26 ENCOUNTER — Ambulatory Visit: Payer: Managed Care, Other (non HMO) | Admitting: Oncology

## 2015-12-04 IMAGING — CR DG CHEST 2V
2 series · 2 of 2 positions shown · non-contrast
Comparison: None

CLINICAL DATA: Chest pain, elevated blood pressure tonight,
palpitations, breast cancer post RIGHT mastectomy 2 years ago, RIGHT
implant rupture, on antibiotics

EXAM:
CHEST  2 VIEW

[w chest pa]
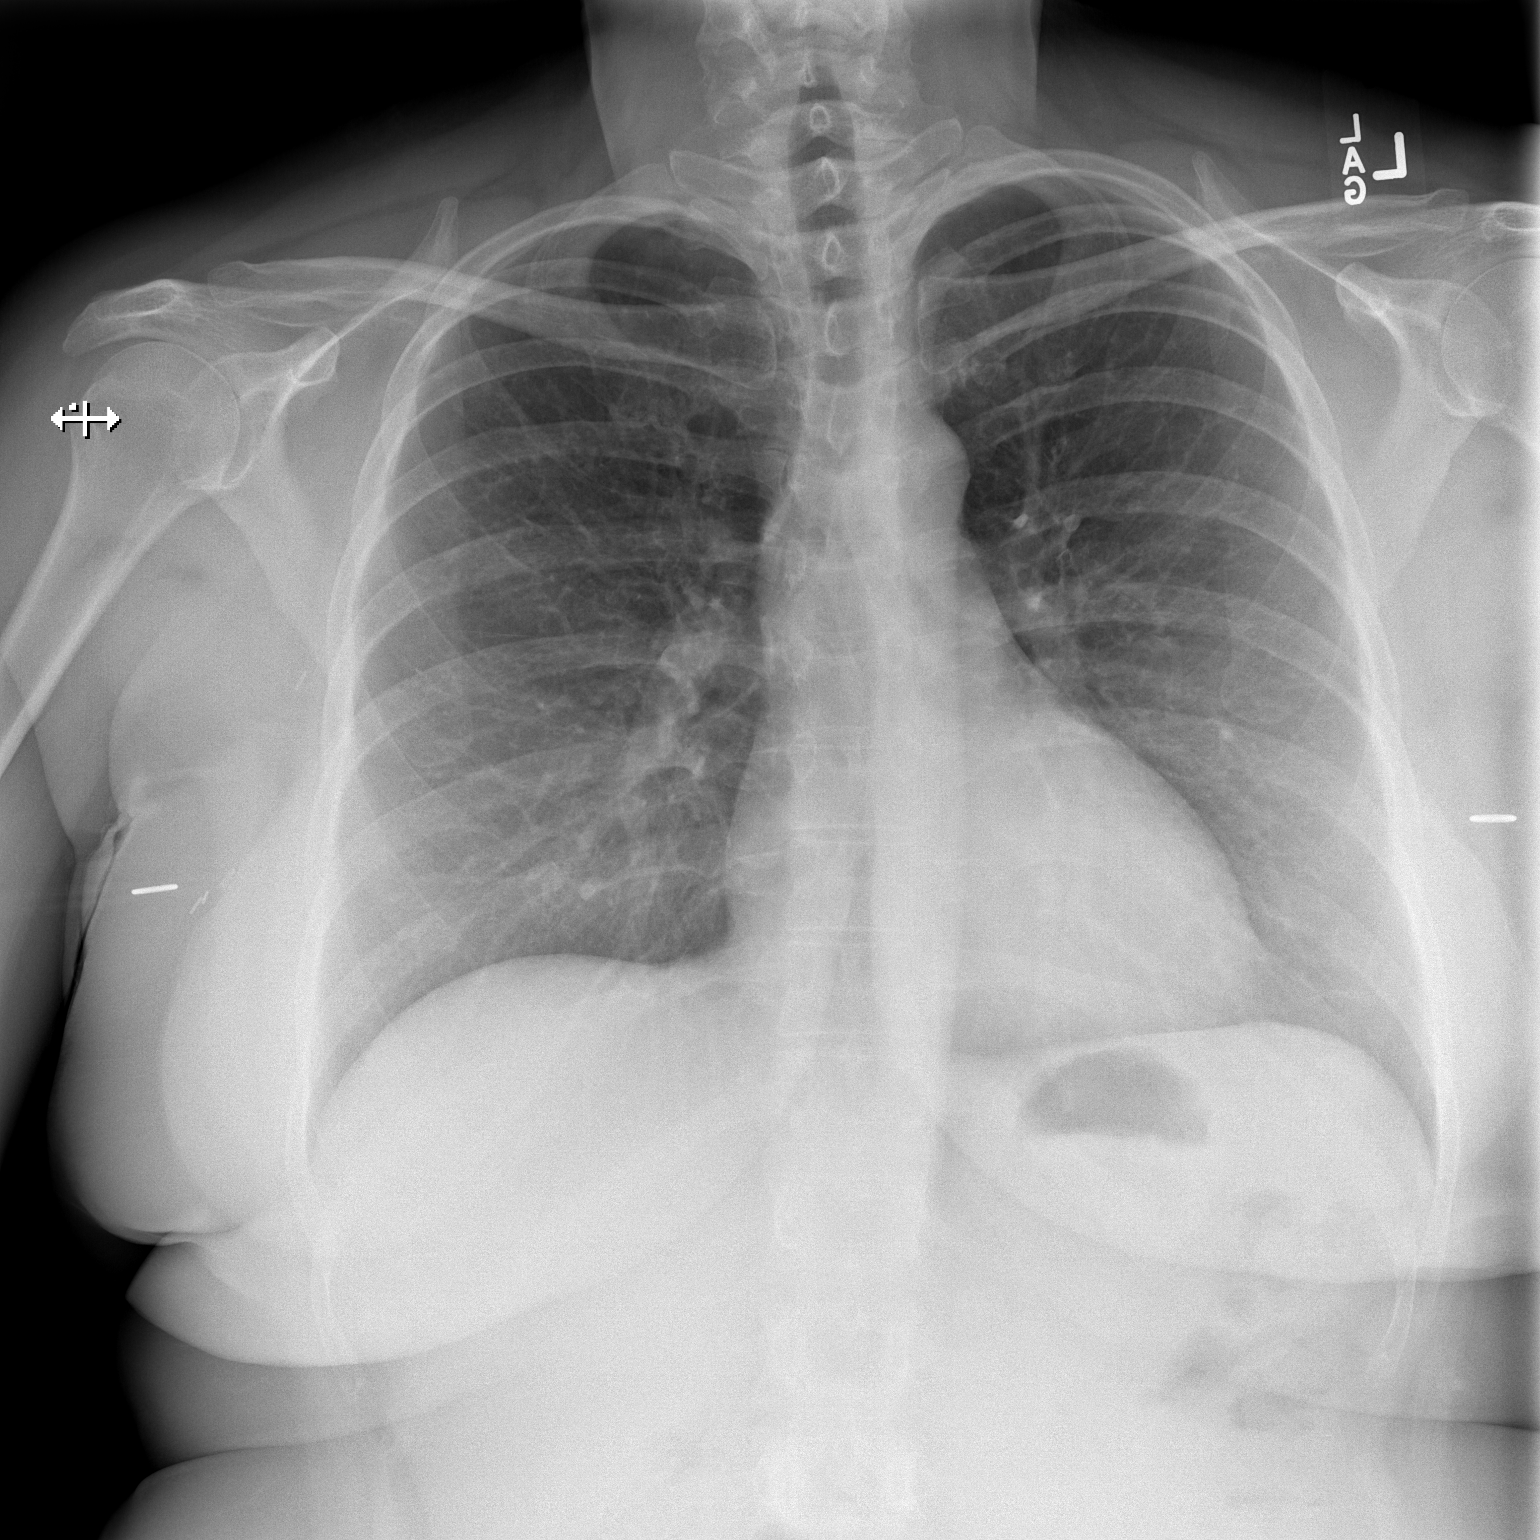

[w chest lat]
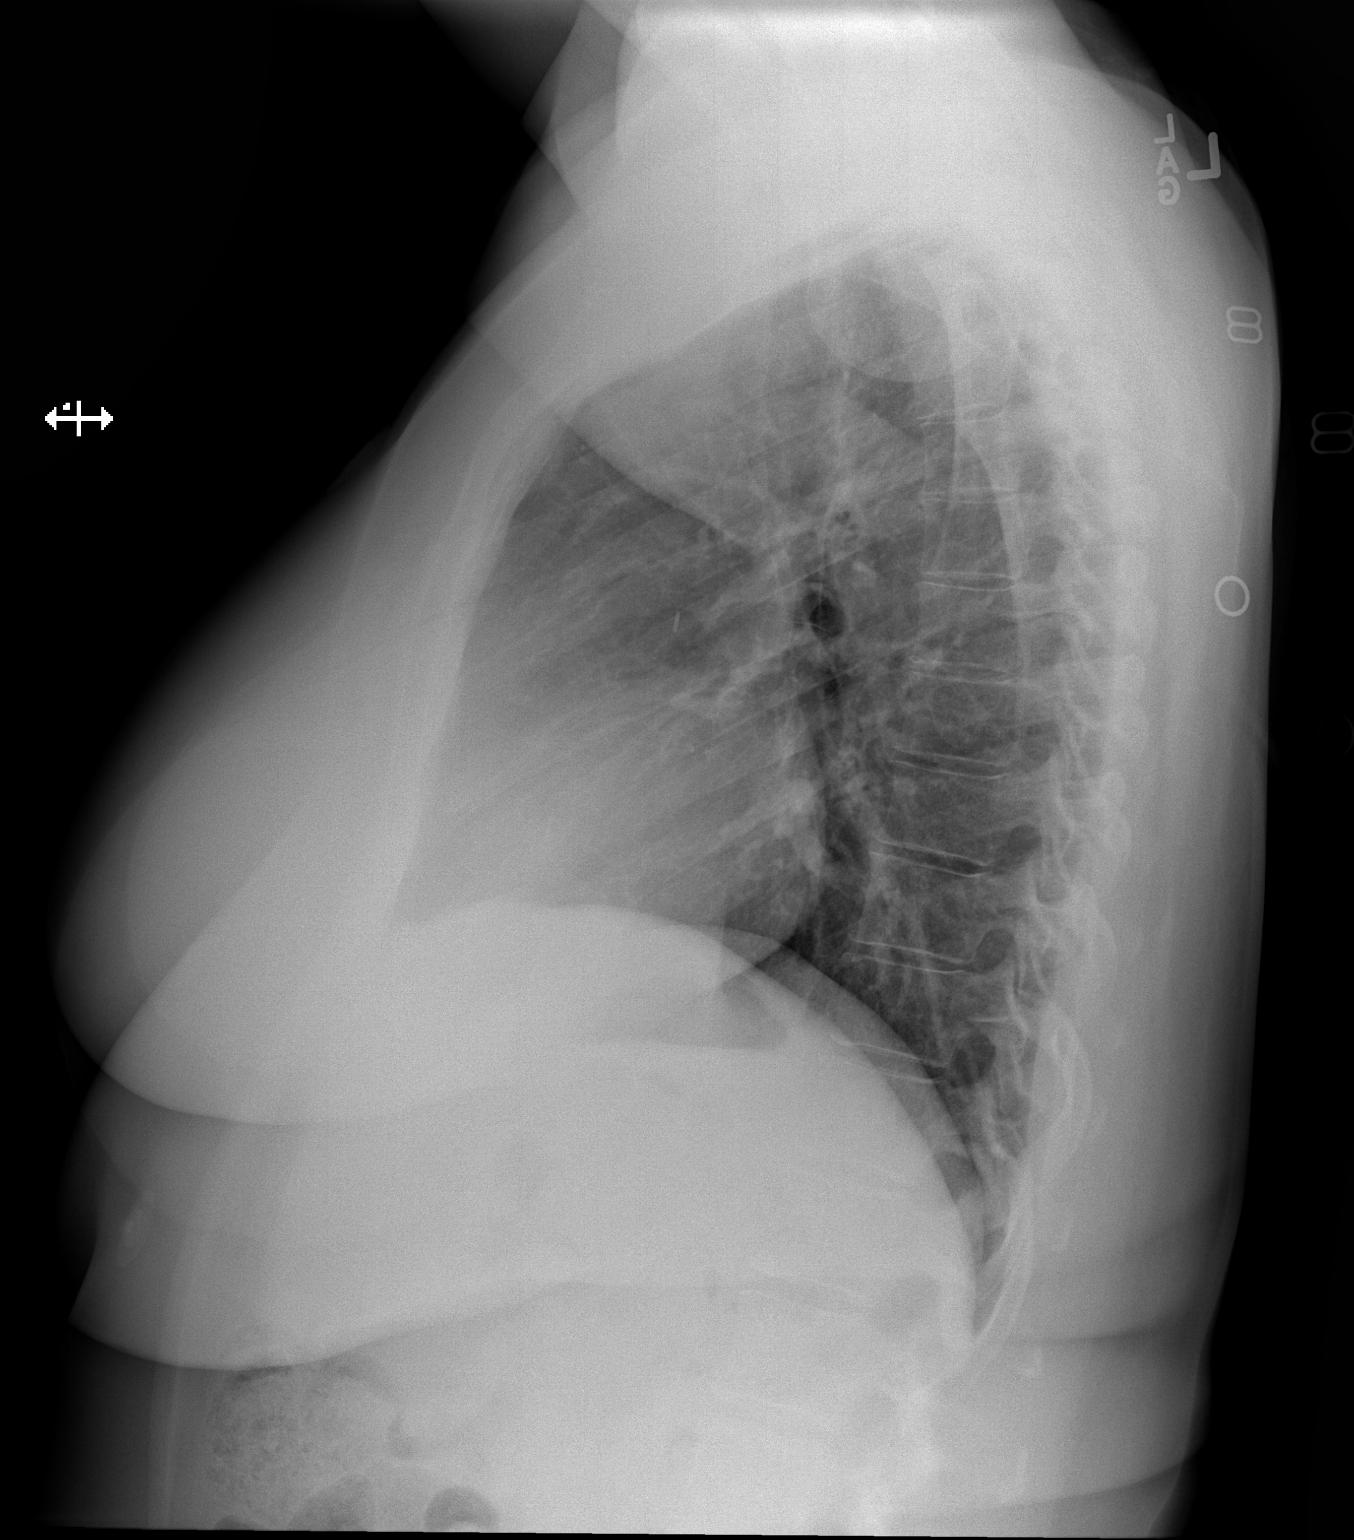

[2 of 2 positions shown; findings below may reference images not displayed]

FINDINGS: BILATERAL breast prostheses.

Normal heart size, mediastinal contours, and pulmonary vascularity.

Lungs clear.

No pleural effusion or pneumothorax.

Bones unremarkable.
IMPRESSION: No acute abnormalities.

## 2016-05-07 IMAGING — CT CT CHEST W/ CM
2 of 4 series · 15 of 36 positions shown, 18 images · IV contrast (OMNIPAQUE)
Comparison: None.

CLINICAL DATA: History of breast cancer diagnosed in 0620. Palpable
abnormality involving the right breast. Possible implant rupture.

EXAM:
CT CHEST WITH CONTRAST
TECHNIQUE: Multidetector CT imaging of the chest was performed during
intravenous contrast administration.
CONTRAST:  80mL OMNIPAQUE IOHEXOL 300 MG/ML  SOLN

[Series 2: chest with st · axial · 0.82mm/px · z∈[-366,-80]mm · 12 of 67 slices shown, 15 images]
[im 5/67  mediastinal]
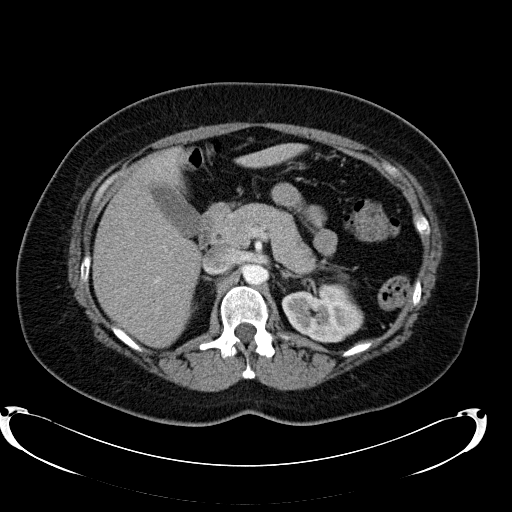
[im 5/67  lung]
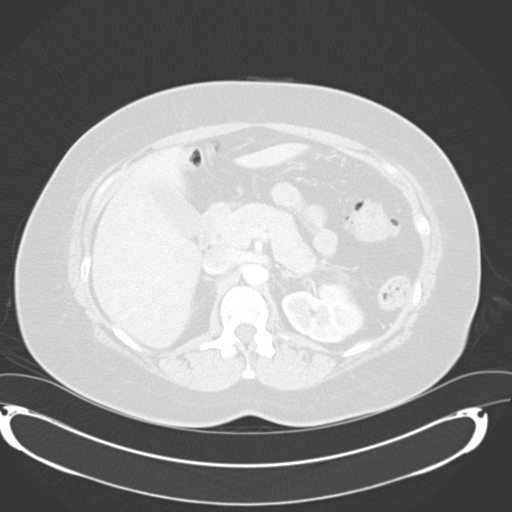
[im 10/67  lung]
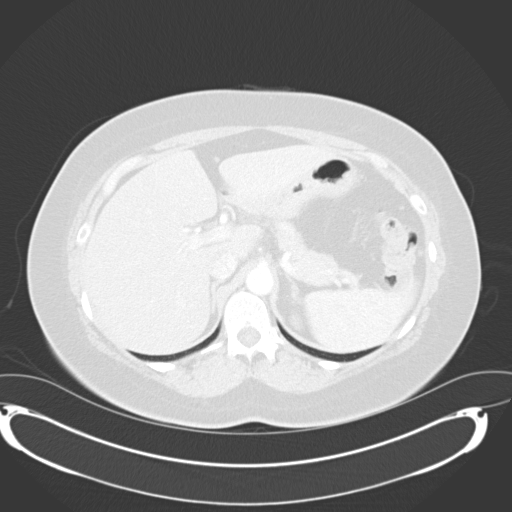
[im 15/67  lung]
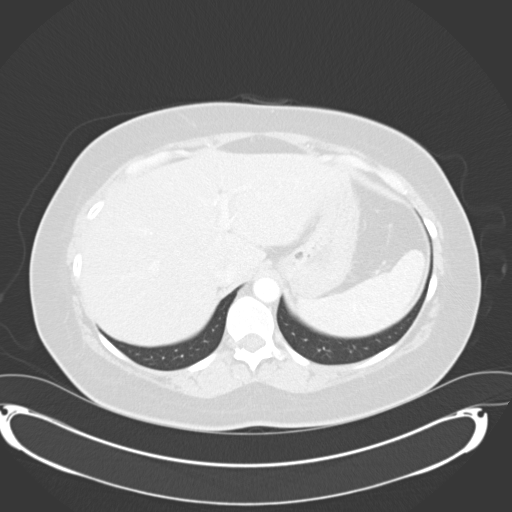
[im 19/67  lung]
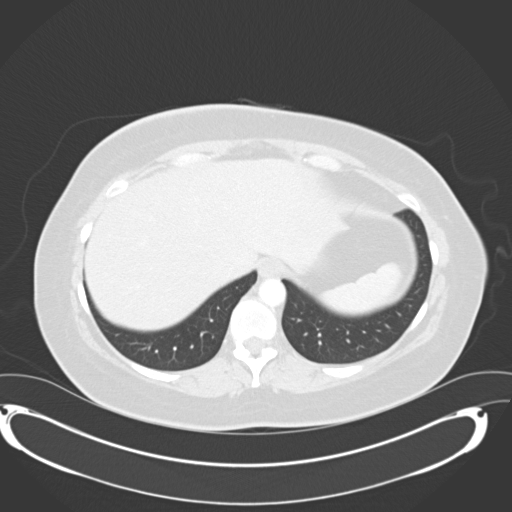
[im 24/67  mediastinal]
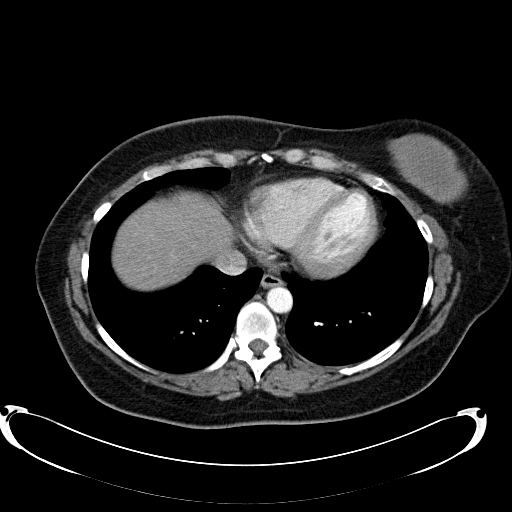
[im 24/67  lung]
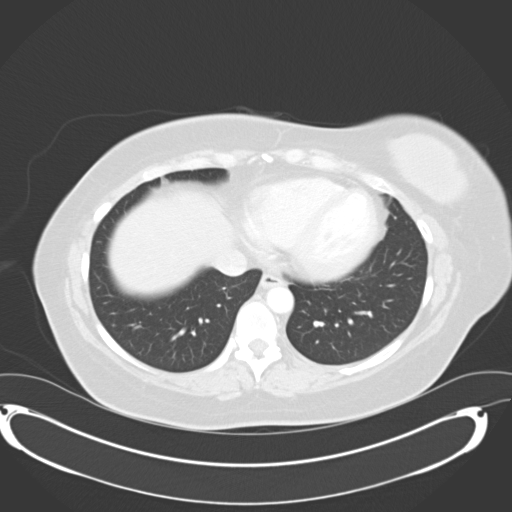
[im 29/67  lung]
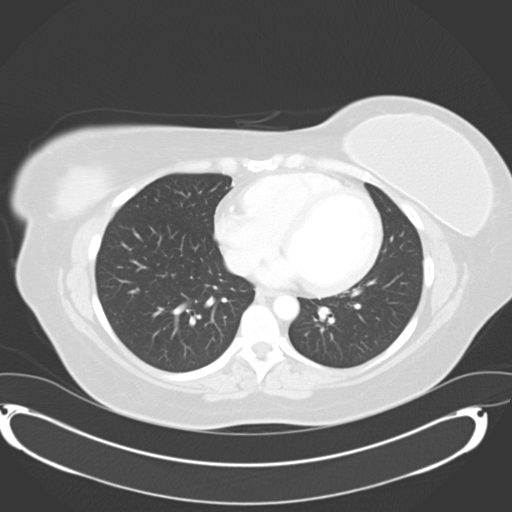
[im 38/67  lung]
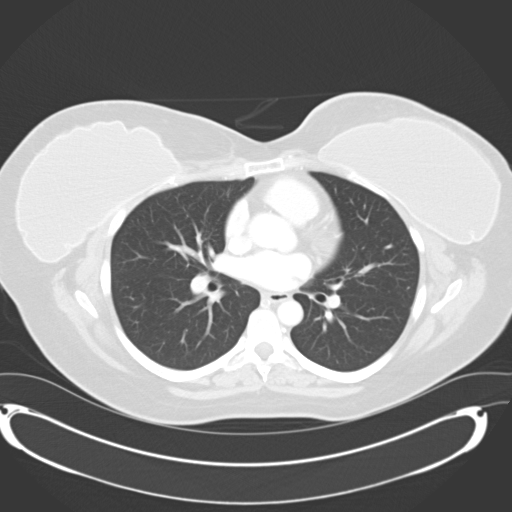
[im 43/67  lung]
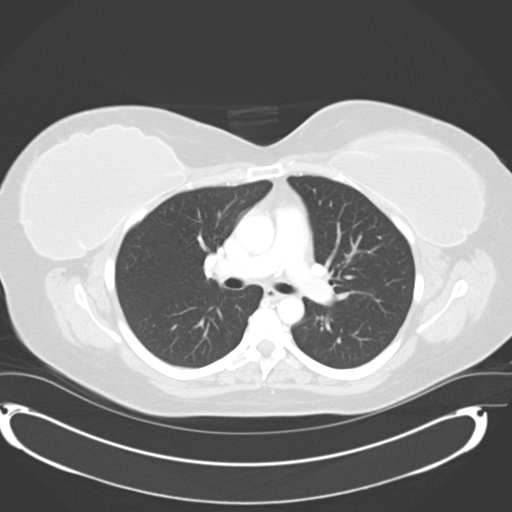
[im 48/67  mediastinal]
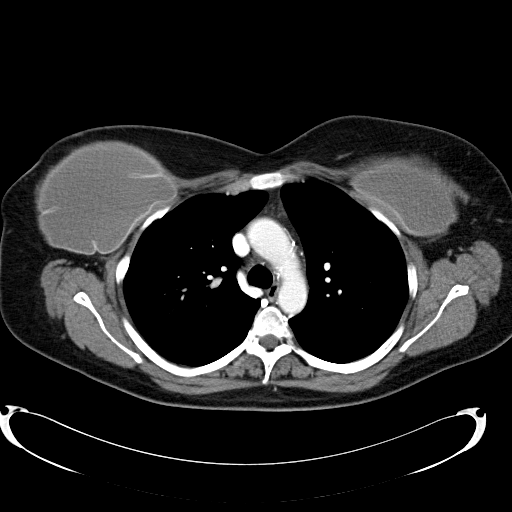
[im 48/67  lung]
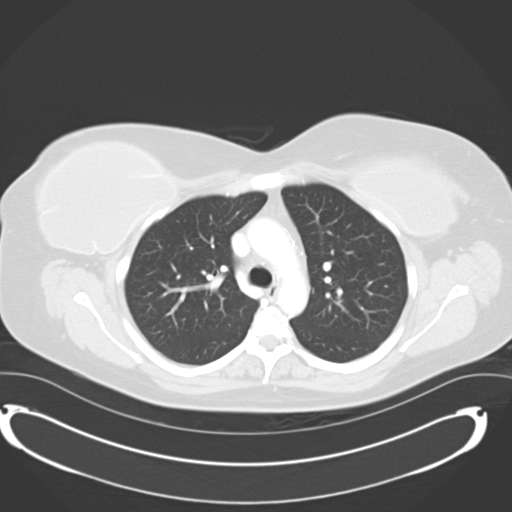
[im 52/67  lung]
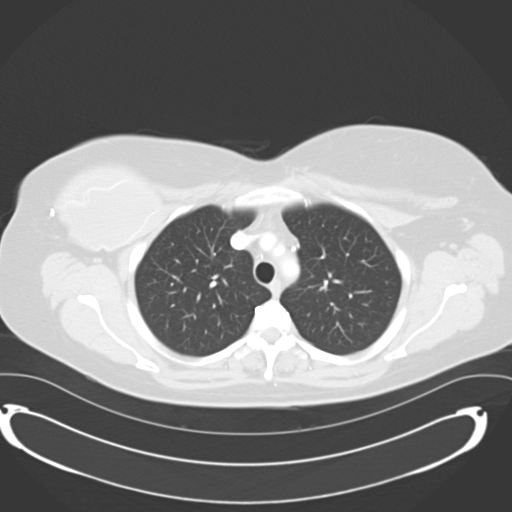
[im 57/67  lung]
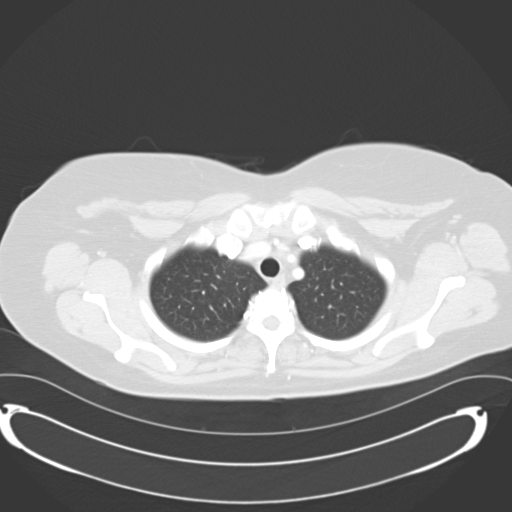
[im 62/67  lung]
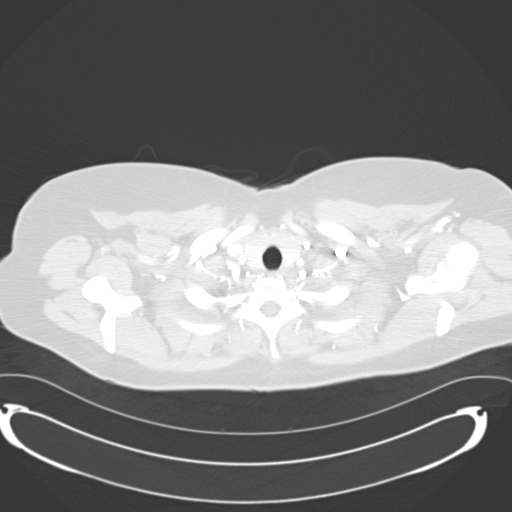

[Series 602: <mpr thick range> · coronal · 0.82mm/px · 3 of 89 slices shown]
[im 18/89  lung]
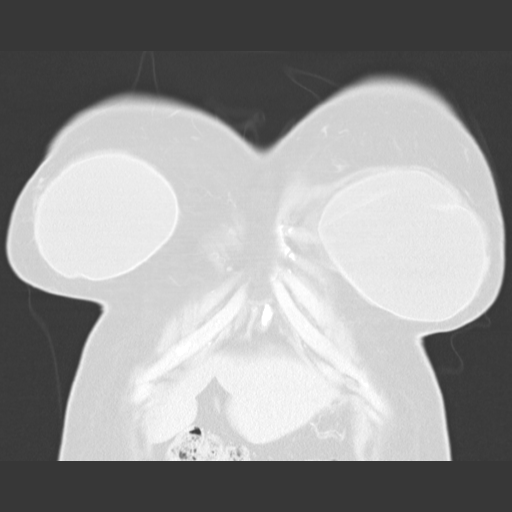
[im 36/89  lung]
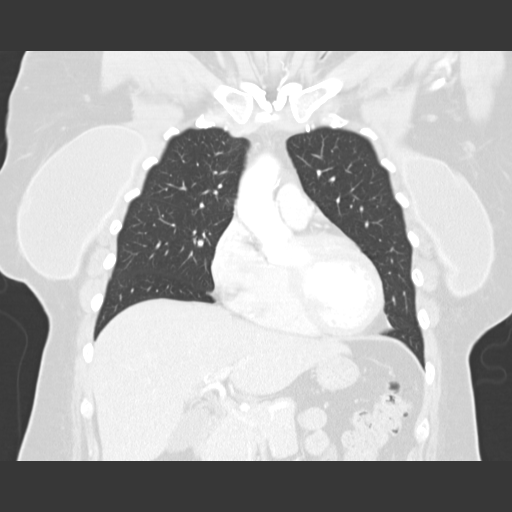
[im 53/89  lung]
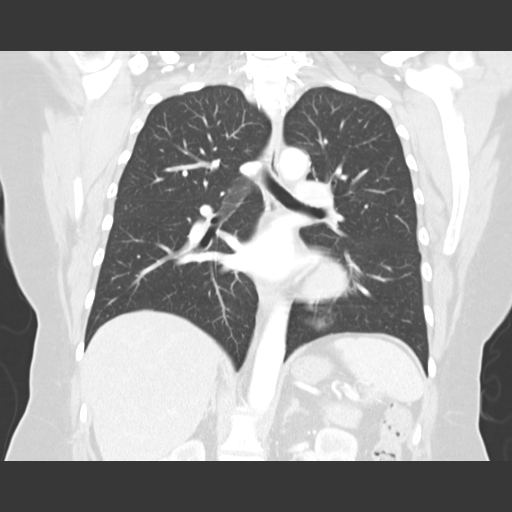

[15 of 36 positions shown; findings below may reference images not displayed]

FINDINGS: Chest wall: There are bilateral breast implants noted. The right
implant is position slightly more cephalad in the right chest wall
compared to the left. The contour of the right implant is also
somewhat wavy. There are probable radial folds bilaterally. I do not
see any obvious intra or extracapsular rupture but recommend
correlation with recent MRI as this is a more sensitive examination.
There is 1 small soft tissue nodule between the right breast implant
and the skin, which measures approximately 14 mm. This is an
indeterminate finding by CT scan but again, recommend correlation
with recent MRI and ultrasound if available.

No supraclavicular or axillary lymphadenopathy. No chest wall mass
or subpectoral lymph nodes.

The thyroid gland appears normal. The bony thorax is intact. No
lytic or sclerotic bone lesions to suggest metastasis. No spinal
canal compromise.

Mediastinum: Collateral vessels are noted. There appears to be
chronic occlusion of the left brachiocephalic vein possibly due to
prior Port-A-Cath. Significant azygos collaterals are noted.

The heart is normal in size. No pericardial effusion. No mediastinal
or hilar mass or adenopathy. The aorta is normal in caliber. No
dissection. The esophagus is grossly normal.

Lungs/pleura: The lungs are clear. No pulmonary nodules to suggest
metastatic disease. No pleural effusion or pleural nodularity.

Upper abdomen: No significant findings. Mild fatty infiltration of
the liver is noted. No upper abdominal metastatic disease.
IMPRESSION: 1. No definite CT findings for implant rupture. Radial folds are
noted bilaterally. The right breast prosthesis is positioned
slightly higher in the chest wall than the left and the contour is
slightly wavy. Recommend correlation with recent MRI examination.
2. One small soft tissue nodule between the right breast implant and
the skin. This is an indeterminate finding. Recommend correlation
with recent MRI and ultrasound if available.
3. No supraclavicular, axillary, mediastinal or hilar
lymphadenopathy.
4. Normal lungs.
5. No worrisome bone lesions.
6. Extensive collateral vessels likely due to chronic left
brachiocephalic vein occlusion.

## 2017-04-16 ENCOUNTER — Ambulatory Visit: Payer: Managed Care, Other (non HMO) | Admitting: Family

## 2017-04-20 ENCOUNTER — Ambulatory Visit (HOSPITAL_COMMUNITY)
Admission: EM | Admit: 2017-04-20 | Discharge: 2017-04-20 | Disposition: A | Discharge status: Home / Self Care | Payer: Commercial Managed Care - PPO | Attending: Family Medicine | Admitting: Family Medicine

## 2017-04-20 ENCOUNTER — Other Ambulatory Visit: Payer: Self-pay

## 2017-04-20 ENCOUNTER — Encounter (HOSPITAL_COMMUNITY): Payer: Self-pay | Admitting: Emergency Medicine

## 2017-04-20 DIAGNOSIS — M5431 Sciatica, right side: Secondary | ICD-10-CM

## 2017-04-20 MED ORDER — HYDROCODONE-ACETAMINOPHEN 5-325 MG PO TABS: 1.0000 | ORAL_TABLET | Freq: Four times a day (QID) | ORAL | 0 refills | Status: DC | PRN

## 2017-04-20 MED ORDER — PREDNISONE 10 MG (48) PO TBPK: ORAL_TABLET | ORAL | 0 refills | Status: DC

## 2017-04-20 NOTE — ED Triage Notes (Addendum)
Patient has back pain.  History of 2 herniated disc.    Right buttocks and right thigh.  Patient initially had pain one month ago and pain was in lower back.  Patient has been going to chiropractor.  Now low back pain is gone, but right buttocks and hamstring is significantly worse.  Feels like a "charlie horse"  Right foot numb for 3 weeks

## 2017-04-20 NOTE — Discharge Instructions (Signed)
Be aware, pain medications may cause drowsiness. Please do not drive, operate heavy machinery or make important decisions while on this medication, it can cloud your judgement.  

## 2017-04-20 NOTE — ED Provider Notes (Signed)
[name]   [id number] 04/20/17 Arrival Time: 1003  ASSESSMENT & PLAN:  1. Sciatica of right side     Meds ordered this encounter  Medications  . predniSONE (STERAPRED UNI-PAK 48 TAB) 10 MG (48) TBPK tablet    Sig: Take as directed.    Dispense:  48 tablet    Refill:  0  . HYDROcodone-acetaminophen (NORCO/VICODIN) 5-325 MG tablet    Sig: Take 1 tablet by mouth every 6 (six) hours as needed for moderate pain or severe pain.    Dispense:  8 tablet    Refill:  0   Will make appt with her orthopaedist or PCP to arrange f/u. May eventually need MRI. May f/u here if needed. Medication sedation precautions.  Reviewed expectations re: course of current medical issues. Questions answered. Outlined signs and symptoms indicating need for more acute intervention. Patient verbalized understanding. After Visit Summary given.   SUBJECTIVE: History from: patient.  Leslie Sullivan is a 47 y.o. female who presents with complaint of fairly persistent right sided discomfort of buttock and R posterior leg. Some "numbness" in sole of foot. Onset gradual beginning about a month ago. Injury/trama: no. History of back problems: yes, reported "mild slipped disks" of lower back requiring PT. Currently sees her chiropractor. Previous back surgery: no. Discomfort described as aching and tingling. Certain movements exacerbate the described discomfort. Sometimes better with rest. Extremity weakness: none. Ambulatory without difficulty. Normal bowel/bladder habits. No associated abdominal pain/n/v. Self treatment: tried OTCs with relief of pain. Also has tried muscle relaxer without relief.  Patient reports no fevers, IV drug use, recent back surgeries or procedures, urinary incontinence, or bowel incontinence.  ROS: As per HPI.   OBJECTIVE:  Vitals:   04/20/17 1028  BP: (!) 157/88  Pulse: 80  Resp: 20  Temp: 97.7 F (36.5 C)  TempSrc: Oral  SpO2: 96%    General appearance: alert; no  distress Lungs: unlabored respirations Back: vague discomfort over posterior R thigh and R buttock with exam; FROM at hips with mild discomfort reported; no swelling; no midline back tenderness Extremities: no edema; symmetrical with no gross deformities Skin: warm and dry Neurologic: normal gait; normal symmetric reflexes; normal LE strength; reports decreased sensation over sole of R foot Psychological: alert and cooperative; normal mood and affect  No Known Allergies  Past Medical History:  Diagnosis Date  . Breast cancer ([place])    Left, chemo 09/2010-03/2011, no radiation, bilateral mastectomy  . Chicken pox   . Ectopic pregnancy, tubal about 1996, 1998   x2  . Hypertension    Social History   Socioeconomic History  . Marital status: Married    Spouse name: Not on file  . Number of children: 1  . Years of education: 75  . Highest education level: Not on file  Social Needs  . Financial resource strain: Not on file  . Food insecurity - worry: Not on file  . Food insecurity - inability: Not on file  . Transportation needs - medical: Not on file  . Transportation needs - non-medical: Not on file  Occupational History  . Occupation: Press photographer Rep  Tobacco Use  . Smoking status: Never Smoker  . Smokeless tobacco: Never Used  Substance and Sexual Activity  . Alcohol use: Yes    Alcohol/week: 0.0 oz    Comment: socially  . Drug use: No  . Sexual activity: Yes    Partners: Male    Birth control/protection: Surgical    Comment: hysterectomy  Other Topics Concern  . Not on file  Social History Narrative   Born and raised in [place]. Currently resides in a condo with her husband. 1 dog. Fun: Everything   Denies religious beliefs effecting health care.    Family History  Problem Relation Age of Onset  . Lung cancer Maternal Grandmother        smoker  . Diabetes Paternal Grandmother   . Heart attack Paternal Grandfather        before 13 years old  . Alzheimer's disease  Paternal Grandfather   . Thyroid disease Mother   . Thyroid disease Sister    Past Surgical History:  Procedure Laterality Date  . BLADDER SUSPENSION  03/2010   mesh, with hyst in 2012  . BREAST ENHANCEMENT SURGERY Bilateral 05/2011   implants following mastectomy  . [place]   tubes removed in 1996  . LAPAROSCOPIC ASSISTED VAGINAL HYSTERECTOMY  07/22/2010   pelvic organ prolapse, cystocele, LOA with Transobrurator tape procedure - ovaries remain, tubes previously removed  . MASTECTOMY Bilateral 10/2010      . TUBAL LIGATION       Vanessa Kick, MD 04/20/17 1112

## 2017-04-21 NOTE — Progress Notes (Signed)
Leslie Sullivan - 47 y.o. female MRN [id number]  Date of birth: July 21, 1970  SUBJECTIVE:  Including CC & ROS.  Chief Complaint  Patient presents with  . Back Pain    Leslie Sullivan is a 47 y.o. female that is presenting with low back pain. Pain is localized to her lower right back and radiates to her right hamstring. Ongoing for three weeks. Pain is a sharp stabbing.  Denies injury or trauma. She sits for long periods of time at work. Pain is severe if sitting. She has been getting adjustments done my her chiropractor. She went to urgent care on 04/20/17. She has been taking Vicodin and prednisone with no improvement.    Independent review of MRI lumbar spine from 2016 shows disc protrusion at L2-3.    Review of Systems  Constitutional: Negative for fever.  HENT: Negative for congestion.   Eyes: Negative for visual disturbance.  Respiratory: Negative for cough.   Cardiovascular: Negative for chest pain.  Gastrointestinal: Negative for abdominal pain.  Musculoskeletal: Positive for back pain. Negative for gait problem.  Skin: Negative for color change.  Allergic/Immunologic: Negative for immunocompromised state.  Neurological: Positive for numbness. Negative for weakness.  Hematological: Negative for adenopathy.    HISTORY: Past Medical, Surgical, Social, and Family History Reviewed & Updated per EMR.   Pertinent Historical Findings include:  Past Medical History:  Diagnosis Date  . Breast cancer ([place])    Left, chemo 09/2010-03/2011, no radiation, bilateral mastectomy  . Chicken pox   . Ectopic pregnancy, tubal about 1996, 1998   x2  . Hypertension     Past Surgical History:  Procedure Laterality Date  . BLADDER SUSPENSION  03/2010   mesh, with hyst in 2012  . BREAST ENHANCEMENT SURGERY Bilateral 05/2011   implants following mastectomy  . [place]   tubes removed in 1996  . LAPAROSCOPIC ASSISTED VAGINAL HYSTERECTOMY  07/22/2010   pelvic organ prolapse,  cystocele, LOA with Transobrurator tape procedure - ovaries remain, tubes previously removed  . MASTECTOMY Bilateral 10/2010      . TUBAL LIGATION      No Known Allergies  Family History  Problem Relation Age of Onset  . Lung cancer Maternal Grandmother        smoker  . Diabetes Paternal Grandmother   . Heart attack Paternal Grandfather        before 36 years old  . Alzheimer's disease Paternal Grandfather   . Thyroid disease Mother   . Thyroid disease Sister      Social History   Socioeconomic History  . Marital status: Married    Spouse name: Not on file  . Number of children: 1  . Years of education: 20  . Highest education level: Not on file  Social Needs  . Financial resource strain: Not on file  . Food insecurity - worry: Not on file  . Food insecurity - inability: Not on file  . Transportation needs - medical: Not on file  . Transportation needs - non-medical: Not on file  Occupational History  . Occupation: Press photographer Rep  Tobacco Use  . Smoking status: Never Smoker  . Smokeless tobacco: Never Used  Substance and Sexual Activity  . Alcohol use: Yes    Alcohol/week: 0.0 oz    Comment: socially  . Drug use: No  . Sexual activity: Yes    Partners: Male    Birth control/protection: Surgical    Comment: hysterectomy  Other Topics Concern  . Not  on file  Social History Narrative   Born and raised in [place]. Currently resides in a condo with her husband. 1 dog. Fun: Everything   Denies religious beliefs effecting health care.      PHYSICAL EXAM:  VS: BP 138/86 (BP Location: Left Arm, Patient Position: Supine, Cuff Size: Normal)   Pulse 80   Ht 5\' 6"  (1.676 m)   Wt 212 lb (96.2 kg)   LMP 04/10/2010 (Approximate)   BMI 34.22 kg/m  Physical Exam Gen: NAD, alert, cooperative with exam, well-appearing ENT: normal lips, normal nasal mucosa,  Eye: normal EOM, normal conjunctiva and lids CV:  no edema, +2 pedal pulses   Resp: no accessory muscle use,  non-labored,  Skin: no rashes, no areas of induration  Neuro: normal tone, normal sensation to touch Psych:  normal insight, alert and oriented MSK:  Back: Tenderness to palpation over the piriformis and biceps femoris.  Normal strength to resistance with knee flexion and extension  Normal plantarflexoin and dorsalflexion  Normal gait  Neurovascularly intact      ASSESSMENT & PLAN:   Low back pain Pain seems to be related to sciatica down the right side.  - referral to PT  - gabapentin  - if no improvement then consider MRI and epidural therapy

## 2017-04-22 ENCOUNTER — Encounter: Payer: Self-pay | Admitting: Family Medicine

## 2017-04-22 ENCOUNTER — Ambulatory Visit (INDEPENDENT_AMBULATORY_CARE_PROVIDER_SITE_OTHER): Payer: Commercial Managed Care - PPO | Admitting: Family Medicine

## 2017-04-22 VITALS — BP 138/86 | HR 80 | Ht 66.0 in | Wt 212.0 lb

## 2017-04-22 DIAGNOSIS — M545 Low back pain, unspecified: Secondary | ICD-10-CM | POA: Insufficient documentation

## 2017-04-22 DIAGNOSIS — M5441 Lumbago with sciatica, right side: Secondary | ICD-10-CM

## 2017-04-22 MED ORDER — GABAPENTIN 300 MG PO CAPS: 300.0000 mg | ORAL_CAPSULE | Freq: Three times a day (TID) | ORAL | 3 refills | Status: DC

## 2017-04-22 NOTE — Assessment & Plan Note (Signed)
Pain seems to be related to sciatica down the right side.  - referral to PT  - gabapentin  - if no improvement then consider MRI and epidural therapy

## 2017-04-22 NOTE — Patient Instructions (Signed)
Please try the exercises  Please try the gabapentin. This can cause some sleepiness. So start with one pill and increase from there  Please follow up with me if you don't have any improvement after 4-6 weeks.

## 2017-05-13 ENCOUNTER — Telehealth: Payer: Self-pay | Admitting: Family Medicine

## 2017-05-13 DIAGNOSIS — M5441 Lumbago with sciatica, right side: Secondary | ICD-10-CM

## 2017-05-13 NOTE — Telephone Encounter (Signed)
Copied from [place] [id number]. Topic: Quick Communication - Rx Refill/Question >> May 13, 2017 12:06 PM Margot Ables wrote: gabapentin (NEURONTIN) 300 MG capsule - Pt started taking 2 in morning, 1 mid-day, and 2 at night about 2 weeks ago. RX for 3/day so she is out of the medication and cannot refill at the pharmacy. Pt is going out of town so will use a different pharmacy. Pt saw Dr. Raeford Razor 04/22/17. Please advise.  [id number] - [place], [place] - [address] AT [place] [phone] (Phone) [phone] (Fax)

## 2017-05-14 MED ORDER — GABAPENTIN 300 MG PO CAPS: 300.0000 mg | ORAL_CAPSULE | Freq: Three times a day (TID) | ORAL | 3 refills | Status: DC

## 2017-05-14 NOTE — Telephone Encounter (Signed)
Medication sent   Rosemarie Ax, MD [facility] 05/14/2017, 12:38 PM

## 2017-05-14 NOTE — Telephone Encounter (Signed)
Pt calling to check status on getting a call back from the physician or nurse regarding the Neurontin.

## 2017-05-20 ENCOUNTER — Emergency Department (HOSPITAL_COMMUNITY)
Admission: EM | Admit: 2017-05-20 | Discharge: 2017-05-20 | Disposition: A | Discharge status: Home / Self Care | Payer: Commercial Managed Care - PPO | Attending: Emergency Medicine | Admitting: Emergency Medicine

## 2017-05-20 ENCOUNTER — Encounter (HOSPITAL_COMMUNITY): Payer: Self-pay

## 2017-05-20 ENCOUNTER — Other Ambulatory Visit: Payer: Self-pay

## 2017-05-20 DIAGNOSIS — Z79899 Other long term (current) drug therapy: Secondary | ICD-10-CM | POA: Diagnosis not present

## 2017-05-20 DIAGNOSIS — G8929 Other chronic pain: Secondary | ICD-10-CM | POA: Diagnosis not present

## 2017-05-20 DIAGNOSIS — Z853 Personal history of malignant neoplasm of breast: Secondary | ICD-10-CM | POA: Diagnosis not present

## 2017-05-20 DIAGNOSIS — M545 Low back pain: Secondary | ICD-10-CM | POA: Diagnosis not present

## 2017-05-20 DIAGNOSIS — I10 Essential (primary) hypertension: Secondary | ICD-10-CM | POA: Insufficient documentation

## 2017-05-20 NOTE — ED Notes (Signed)
Pt left without being discharged properly. Pt left when pt was up for discharge however pt was  Not in room when RN went in to discharge pt.

## 2017-05-20 NOTE — ED Triage Notes (Signed)
Pt reports right sided lower back pain that radiates to her right leg. Pt has hx of Sciatica and Chronic Back Pain. Pt is requesting to have an MRI for this during this ED visit. Pt A+OX4, normal gait, NAD.

## 2017-05-20 NOTE — ED Provider Notes (Signed)
[facility] DEPT Provider Note   CSN: [id number] Arrival date & time: 05/20/17  1104     History   Chief Complaint Chief Complaint  Patient presents with  . Sciatica    HPI Leslie Sullivan is a 47 y.o. female with history of breast cancer with bilateral mastectomy currently in remission for 4 years, hypertension, chronic back pain is here for back MRI.  Reports ongoing right-sided low back pain that radiates to the right buttock and to the right hamstring for 2 months.  Associated symptoms include tingling sensation to the heel and plantar aspect of her right foot.  The pain is constant, exacerbated with movement, bending forward and palpation.  Has seen her PCP for this who is referring her to an orthopedist, her appointment with orthopedist tomorrow.  She states she has been waiting for this for 2 months and is hoping to have an MRI in the emergency department to help with her orthopedist tomorrow have results and speed up the process. Has not called her PCP about this.  Has tried prednisone, Vicodin, massage, chiropractor without improvement.  Previous MRI showed bulging disc.  She denies any recent fall, trauma, abdominal pain, urinary symptoms, flank pain, saddle anesthesia, numbness or weakness distally.  No IV drug use, fevers.  HPI  Past Medical History:  Diagnosis Date  . Breast cancer ([place])    Left, chemo 09/2010-03/2011, no radiation, bilateral mastectomy  . Chicken pox   . Ectopic pregnancy, tubal about 1996, 1998   x2  . Hypertension     Patient Active Problem List   Diagnosis Date Noted  . Low back pain 04/22/2017  . Right lumbar radiculopathy 10/16/2014  . Routine general medical examination at a health care facility 03/15/2014  . Hypertension 02/16/2014  . Cellulitis 02/16/2014  . Breast cancer, right breast ([place]) 02/14/2014  . History of breast cancer 06/02/2012    Past Surgical History:  Procedure Laterality Date  . BLADDER  SUSPENSION  03/2010   mesh, with hyst in 2012  . BREAST ENHANCEMENT SURGERY Bilateral 05/2011   implants following mastectomy  . [place]   tubes removed in 1996  . LAPAROSCOPIC ASSISTED VAGINAL HYSTERECTOMY  07/22/2010   pelvic organ prolapse, cystocele, LOA with Transobrurator tape procedure - ovaries remain, tubes previously removed  . MASTECTOMY Bilateral 10/2010      . TUBAL LIGATION       OB History    Gravida  4   Para  1   Term  1   Preterm      AB  3   Living  1     SAB      TAB      Ectopic  2   Multiple      Live Births  1            Home Medications    Prior to Admission medications   Medication Sig Start Date End Date Taking? Authorizing Provider  gabapentin (NEURONTIN) 300 MG capsule Take 1 capsule (300 mg total) by mouth 3 (three) times daily. 05/14/17  Yes Rosemarie Ax, MD  HYDROcodone-acetaminophen (NORCO/VICODIN) 5-325 MG tablet Take 1 tablet by mouth every 6 (six) hours as needed for moderate pain or severe pain. Patient not taking: Reported on 05/20/2017 04/20/17   Vanessa Kick, MD  predniSONE (STERAPRED UNI-PAK 48 TAB) 10 MG (48) TBPK tablet Take as directed. Patient not taking: Reported on 05/20/2017 04/20/17   Vanessa Kick, MD  Family History Family History  Problem Relation Age of Onset  . Lung cancer Maternal Grandmother        smoker  . Diabetes Paternal Grandmother   . Heart attack Paternal Grandfather        before 32 years old  . Alzheimer's disease Paternal Grandfather   . Thyroid disease Mother   . Thyroid disease Sister     Social History Social History   Tobacco Use  . Smoking status: Never Smoker  . Smokeless tobacco: Never Used  Substance Use Topics  . Alcohol use: Yes    Alcohol/week: 0.0 oz    Comment: socially  . Drug use: No     Allergies   Patient has no known allergies.   Review of Systems Review of Systems  Musculoskeletal: Positive for back pain.  All other  systems reviewed and are negative.    Physical Exam Updated Vital Signs BP (!) 148/100 (BP Location: Left Arm)   Pulse 99   Temp 98.1 F (36.7 C) (Oral)   Resp 18   LMP 04/10/2010 (Approximate)   SpO2 99%   Physical Exam  Constitutional: She is oriented to person, place, and time. She appears well-developed and well-nourished. No distress.  HENT:  Head: Normocephalic and atraumatic.  Nose: Nose normal.  Eyes: EOM are normal.  Neck: Normal range of motion.  Cardiovascular: Normal rate, S1 normal, S2 normal and normal heart sounds.  Pulses:      Radial pulses are 2+ on the right side, and 2+ on the left side.       Dorsalis pedis pulses are 2+ on the right side, and 2+ on the left side.  Pulmonary/Chest: Effort normal and breath sounds normal. She has no decreased breath sounds.  Abdominal: Soft. Normal appearance and bowel sounds are normal. There is no tenderness.  No suprapubic or CVA tenderness   Musculoskeletal: She exhibits tenderness.       Lumbar back: She exhibits tenderness and pain.  CT spine: No midline tenderness. L-spine: No midline tenderness.  No left-sided SI joint or sciatic notch tenderness.  Mild tenderness to right paraspinal muscles, SI joint, sciatic notch with deep pressure.  Positive straight leg raise on the right.  Negative Faber. Pelvis: There is no obvious instability with AP/L compression.  No limb shortening or rotation. Patient able to move around the bed without significant difficulty.  Neurological: She is alert and oriented to person, place, and time.  5/5 strength with flexion/extension of hip, knee and ankle, bilaterally.  Sensation to light touch intact in lower extremities including feet Symmetric patellar DTRs bilaterally  Skin: Skin is warm and dry. Capillary refill takes less than 2 seconds.  No rash to back or buttocks.  Psychiatric: She has a normal mood and affect. Her speech is normal and behavior is normal. Judgment and thought  content normal. Cognition and memory are normal.  Nursing note and vitals reviewed.    ED Treatments / Results  Labs (all labs ordered are listed, but only abnormal results are displayed) Labs Reviewed - No data to display  EKG None  Radiology No results found.  Procedures Procedures (including critical care time)  Medications Ordered in ED Medications - No data to display   Initial Impression / Assessment and Plan / ED Course  I have reviewed the triage vital signs and the nursing notes.  Pertinent labs & imaging results that were available during my care of the patient were reviewed by me and considered in my  medical decision making (see chart for details).     47 year old here hoping to obtain an MRI in the emergency department for her upcoming orthopedist appointment tomorrow.  Has long history of chronic back pain, based on exam and history most likely radiculopathy.  She has known bulging disks.  No recent trauma.  No red flags of back pain including falls, so anesthesia, numbness or weakness, rash, fevers, abdominal pain, kidney stone history, CVA tenderness.  Her pain has been persistent, unchanged to recently.  Patient explained she was very frustrated in regards to how long it has taken for her to follow-up with orthopedist and had hoped to get an MRI done today.  Explained emergent MRI or lab work not indicated today given chronicity of symptoms, benign exam and no red flags. This would not be cost effective for her either.  She verbalized understanding, agreeable with discharge.  I encouraged her to call her PCP/orthopedist.  She has appointment with orthopedist tomorrow.  Final Clinical Impressions(s) / ED Diagnoses   Final diagnoses:  Chronic right-sided low back pain, with sciatica presence unspecified    ED Discharge Orders    None       Arlean Hopping 05/20/17 1403    Tanna Furry, MD 05/25/17 [phone]

## 2017-05-20 NOTE — Discharge Instructions (Signed)
You may be able to call your primary care doctor who can order an outpatient MRI for later today, so you can have results by your appointment tomorrow.  If this does not work, I recommend waiting til orthopedics appointment tomorrow and seeing if they can expedite the MRI order.    Return for fevers, groin numbness, loss of bladder or bowel control, one sided heaviness, weakness, complete numbness to your extremities. These would be emergent reasons to order an MRI in the emergency department.

## 2017-05-21 DIAGNOSIS — M51369 Other intervertebral disc degeneration, lumbar region without mention of lumbar back pain or lower extremity pain: Secondary | ICD-10-CM | POA: Insufficient documentation

## 2017-05-21 DIAGNOSIS — M5136 Other intervertebral disc degeneration, lumbar region: Secondary | ICD-10-CM | POA: Insufficient documentation

## 2017-05-31 ENCOUNTER — Ambulatory Visit: Payer: Commercial Managed Care - PPO | Admitting: Family Medicine

## 2017-06-01 ENCOUNTER — Ambulatory Visit (INDEPENDENT_AMBULATORY_CARE_PROVIDER_SITE_OTHER): Payer: Commercial Managed Care - PPO | Admitting: Family Medicine

## 2017-06-01 ENCOUNTER — Encounter: Payer: Self-pay | Admitting: Family Medicine

## 2017-06-01 VITALS — BP 130/100 | HR 89 | Ht 66.5 in | Wt 223.0 lb

## 2017-06-01 DIAGNOSIS — I10 Essential (primary) hypertension: Secondary | ICD-10-CM

## 2017-06-01 DIAGNOSIS — M5416 Radiculopathy, lumbar region: Secondary | ICD-10-CM | POA: Diagnosis not present

## 2017-06-01 NOTE — Patient Instructions (Signed)
It was very nice to meet you I hope your surgery goes well and you get relief from your pain! Please complete a release form to have records sent from Dr. Adelene Idler office

## 2017-06-01 NOTE — Assessment & Plan Note (Signed)
2/2 to HNP of L5-S1, planning on surgery.  I believe that she is fairly low risk for surgery and is cleared to proceed.  She may continue gabapentin for pain control.  Also has tramadol but this has been constipating for her.

## 2017-06-01 NOTE — Assessment & Plan Note (Addendum)
Diastolic pressure elevated but has been well controlled at home where she is able to get more comfortable.  I think it is reasonable to proceed with surgery as long as BP is monitored closely pre and post-operatively.  She plans to continue to work with her naturopath on dietary changes to correct her BP.

## 2017-06-01 NOTE — Progress Notes (Signed)
Subjective:    Patient ID: Leslie Sullivan, female    DOB: 04/14/1970, 47 y.o.   MRN: [id number] Chief Complaint  Patient presents with  . New Patient (Initial Visit)    patient would surgical clearance     HPI Leslie Sullivan is a 47 y.o. female with a history of hypertension and breast cancer here today to establish with new pcp.  She has unfortunately been dealing with low back pain and sciatica for the past couple of months and recently had an MRI showing compression of S1 nerve root on the R by large disc extrusion.  She is planning to have surgery with Dr. Rolena Infante at [facility] however needs clearance prior to having this done.  She is currently using gabapentin to help with pain control which provides mild relief.  She was using ibuprofen as well however discontinued this at the advice of her orthopedic surgeon due to increased bleeding risk with surgery.  She does tell me that she has had surgeries prior and did not have any complications with anesthesia or intubation.  She has not had any chest pain, sob, palpitations, headaches, or vision changes.  She has had some constipation related to pain medication in the past.    In regards to her blood pressure she has never been treated with prescription medication.  She is seeing a naturopathic doctor to help with blood pressure control and has been making changes to her diet.  Her blood pressure is well controlled unless her pain is flared up as it is this morning.     Review of Systems ROS per HPI, otherwise negative.     Past Medical History:  Diagnosis Date  . Breast cancer ([place])    Left, chemo 09/2010-03/2011, no radiation, bilateral mastectomy  . Chicken pox   . Ectopic pregnancy, tubal about 1996, 1998   x2  . Hypertension    Past Surgical History:  Procedure Laterality Date  . BLADDER SUSPENSION  03/2010   mesh, with hyst in 2012  . BREAST ENHANCEMENT SURGERY Bilateral 05/2011   implants following mastectomy  . [place]   tubes removed in 1996  . LAPAROSCOPIC ASSISTED VAGINAL HYSTERECTOMY  07/22/2010   pelvic organ prolapse, cystocele, LOA with Transobrurator tape procedure - ovaries remain, tubes previously removed  . MASTECTOMY Bilateral 10/2010      . TUBAL LIGATION      Objective:   Physical Exam  Constitutional: She is oriented to person, place, and time. No distress.  Laying in prone position on exam table as this is most comfortable for her. She able to sit up on her own.    HENT:  Head: Atraumatic.  Mouth/Throat: Oropharynx is clear and moist.  Neck: Neck supple. No thyromegaly present.  Cardiovascular: Normal rate, regular rhythm and normal heart sounds.  Pulmonary/Chest: Effort normal and breath sounds normal.  Abdominal: There is no tenderness.  Lymphadenopathy:    She has no cervical adenopathy.  Neurological: She is alert and oriented to person, place, and time.  Skin: No rash noted.  Psychiatric: She has a normal mood and affect. Her behavior is normal.          Assessment & Plan:  Right lumbar radiculopathy 2/2 to HNP of L5-S1, planning on surgery.  I believe that she is fairly low risk for surgery and is cleared to proceed.  She may continue gabapentin for pain control.  Also has tramadol but this has been constipating for her.  Hypertension Diastolic pressure elevated but has been well controlled at home where she is able to get more comfortable.  I think it is reasonable to proceed with surgery as long as BP is monitored closely pre and post-operatively.  She plans to continue to work with her naturopath on dietary changes to correct her BP.

## 2017-06-02 ENCOUNTER — Emergency Department (HOSPITAL_COMMUNITY): Payer: Commercial Managed Care - PPO | Admitting: Anesthesiology

## 2017-06-02 ENCOUNTER — Encounter (HOSPITAL_COMMUNITY): Admission: EM | Disposition: A | Discharge status: Home / Self Care | Payer: Self-pay | Source: Home / Self Care

## 2017-06-02 ENCOUNTER — Ambulatory Visit (HOSPITAL_COMMUNITY)
Admission: RE | Admit: 2017-06-02 | Payer: Commercial Managed Care - PPO | Source: Ambulatory Visit | Admitting: Orthopedic Surgery

## 2017-06-02 ENCOUNTER — Observation Stay (HOSPITAL_COMMUNITY)
Admission: EM | Admit: 2017-06-02 | Discharge: 2017-06-03 | Disposition: A | Discharge status: Home / Self Care | Payer: Commercial Managed Care - PPO | Attending: Orthopedic Surgery | Admitting: Orthopedic Surgery

## 2017-06-02 ENCOUNTER — Emergency Department (HOSPITAL_COMMUNITY): Payer: Commercial Managed Care - PPO

## 2017-06-02 ENCOUNTER — Encounter (HOSPITAL_COMMUNITY): Payer: Self-pay

## 2017-06-02 DIAGNOSIS — Z853 Personal history of malignant neoplasm of breast: Secondary | ICD-10-CM | POA: Insufficient documentation

## 2017-06-02 DIAGNOSIS — Z9889 Other specified postprocedural states: Secondary | ICD-10-CM

## 2017-06-02 DIAGNOSIS — M5116 Intervertebral disc disorders with radiculopathy, lumbar region: Secondary | ICD-10-CM | POA: Diagnosis present

## 2017-06-02 DIAGNOSIS — Z6835 Body mass index (BMI) 35.0-35.9, adult: Secondary | ICD-10-CM | POA: Diagnosis not present

## 2017-06-02 DIAGNOSIS — I10 Essential (primary) hypertension: Secondary | ICD-10-CM | POA: Insufficient documentation

## 2017-06-02 DIAGNOSIS — Z9013 Acquired absence of bilateral breasts and nipples: Secondary | ICD-10-CM | POA: Insufficient documentation

## 2017-06-02 DIAGNOSIS — Z419 Encounter for procedure for purposes other than remedying health state, unspecified: Secondary | ICD-10-CM

## 2017-06-02 DIAGNOSIS — Z9221 Personal history of antineoplastic chemotherapy: Secondary | ICD-10-CM | POA: Diagnosis not present

## 2017-06-02 HISTORY — PX: LUMBAR MICRODISCECTOMY: SHX99

## 2017-06-02 LAB — BASIC METABOLIC PANEL
ANION GAP: 10 (ref 5–15)
BUN: 11 mg/dL (ref 6–20)
CO2: 27 mmol/L (ref 22–32)
Calcium: 9.7 mg/dL (ref 8.9–10.3)
Chloride: 103 mmol/L (ref 101–111)
Creatinine, Ser: 0.6 mg/dL (ref 0.44–1.00)
GFR calc Af Amer: >60 mL/min (ref >60)
GLUCOSE: 81 mg/dL (ref 65–99)
Potassium: 4.1 mmol/L (ref 3.5–5.1)
SODIUM: 140 mmol/L (ref 135–145)

## 2017-06-02 LAB — CBC
HCT: 43 % (ref 36.0–46.0)
HEMOGLOBIN: 13.9 g/dL (ref 12.0–15.0)
MCH: 29.8 pg (ref 26.0–34.0)
MCHC: 32.3 g/dL (ref 30.0–36.0)
MCV: 92.1 fL (ref 78.0–100.0)
Platelets: 258 10*3/uL (ref 150–400)
RBC: 4.67 MIL/uL (ref 3.87–5.11)
RDW: 14.5 % (ref 11.5–15.5)
WBC: 7.3 10*3/uL (ref 4.0–10.5)

## 2017-06-02 LAB — SURGICAL PCR SCREEN
MRSA, PCR: NEGATIVE
STAPHYLOCOCCUS AUREUS: NEGATIVE

## 2017-06-02 SURGERY — MICROENDOSCOPIC LUMBAR DISCECTOMY
Anesthesia: General | Laterality: Right

## 2017-06-02 MED ORDER — LABETALOL HCL 5 MG/ML IV SOLN
INTRAVENOUS | Status: AC
  Filled 2017-06-02: qty 4

## 2017-06-02 MED ORDER — SUGAMMADEX SODIUM 200 MG/2ML IV SOLN
INTRAVENOUS | Status: DC | PRN
  Administered 2017-06-02: 200 mg via INTRAVENOUS

## 2017-06-02 MED ORDER — CEFAZOLIN SODIUM-DEXTROSE 2-4 GM/100ML-% IV SOLN
INTRAVENOUS | Status: AC
  Filled 2017-06-02: qty 100

## 2017-06-02 MED ORDER — PHENYLEPHRINE 40 MCG/ML (10ML) SYRINGE FOR IV PUSH (FOR BLOOD PRESSURE SUPPORT)
PREFILLED_SYRINGE | INTRAVENOUS | Status: AC
  Filled 2017-06-02: qty 10

## 2017-06-02 MED ORDER — METHOCARBAMOL 500 MG PO TABS
500.0000 mg | ORAL_TABLET | Freq: Four times a day (QID) | ORAL | Status: DC | PRN
  Filled 2017-06-02: qty 1

## 2017-06-02 MED ORDER — ONDANSETRON HCL 4 MG/2ML IJ SOLN: 4.0000 mg | Freq: Four times a day (QID) | INTRAMUSCULAR | Status: DC | PRN

## 2017-06-02 MED ORDER — MORPHINE SULFATE (PF) 2 MG/ML IV SOLN: 1.0000 mg | INTRAVENOUS | Status: DC | PRN

## 2017-06-02 MED ORDER — FENTANYL CITRATE (PF) 100 MCG/2ML IJ SOLN
INTRAMUSCULAR | Status: AC
  Filled 2017-06-02: qty 2

## 2017-06-02 MED ORDER — CEFAZOLIN SODIUM-DEXTROSE 2-4 GM/100ML-% IV SOLN
2.0000 g | Freq: Three times a day (TID) | INTRAVENOUS | Status: AC
  Administered 2017-06-03 (×2): 2 g via INTRAVENOUS
  Filled 2017-06-02 (×2): qty 100

## 2017-06-02 MED ORDER — ONDANSETRON HCL 4 MG/2ML IJ SOLN: 4.0000 mg | Freq: Once | INTRAMUSCULAR | Status: DC | PRN

## 2017-06-02 MED ORDER — LIDOCAINE 2% (20 MG/ML) 5 ML SYRINGE
INTRAMUSCULAR | Status: AC
Start: 2017-06-02 — End: ?
  Filled 2017-06-02: qty 5

## 2017-06-02 MED ORDER — MIDAZOLAM HCL 5 MG/5ML IJ SOLN
INTRAMUSCULAR | Status: DC | PRN
  Administered 2017-06-02: 2 mg via INTRAVENOUS

## 2017-06-02 MED ORDER — LACTATED RINGERS IV SOLN
INTRAVENOUS | Status: DC
Start: 2017-06-02 — End: 2017-06-03
  Administered 2017-06-02 (×2): via INTRAVENOUS

## 2017-06-02 MED ORDER — SODIUM CHLORIDE 0.9% FLUSH: 3.0000 mL | INTRAVENOUS | Status: DC | PRN

## 2017-06-02 MED ORDER — THROMBIN (RECOMBINANT) 20000 UNITS EX SOLR
CUTANEOUS | Status: DC | PRN
  Administered 2017-06-02: 20000 [IU] via TOPICAL

## 2017-06-02 MED ORDER — DEXAMETHASONE SODIUM PHOSPHATE 10 MG/ML IJ SOLN
INTRAMUSCULAR | Status: DC | PRN
  Administered 2017-06-02: 10 mg via INTRAVENOUS

## 2017-06-02 MED ORDER — FENTANYL CITRATE (PF) 250 MCG/5ML IJ SOLN
INTRAMUSCULAR | Status: AC
  Filled 2017-06-02: qty 5

## 2017-06-02 MED ORDER — HEMOSTATIC AGENTS (NO CHARGE) OPTIME
TOPICAL | Status: DC | PRN
  Administered 2017-06-02: 1 via TOPICAL

## 2017-06-02 MED ORDER — ROCURONIUM BROMIDE 100 MG/10ML IV SOLN
INTRAVENOUS | Status: DC | PRN
  Administered 2017-06-02: 50 mg via INTRAVENOUS

## 2017-06-02 MED ORDER — BUPIVACAINE-EPINEPHRINE 0.25% -1:200000 IJ SOLN
INTRAMUSCULAR | Status: DC | PRN
  Administered 2017-06-02: 10 mL

## 2017-06-02 MED ORDER — ACETAMINOPHEN 325 MG PO TABS: 650.0000 mg | ORAL_TABLET | ORAL | Status: DC | PRN

## 2017-06-02 MED ORDER — METHOCARBAMOL 1000 MG/10ML IJ SOLN
500.0000 mg | Freq: Four times a day (QID) | INTRAVENOUS | Status: DC | PRN
  Filled 2017-06-02: qty 5

## 2017-06-02 MED ORDER — ONDANSETRON HCL 4 MG/2ML IJ SOLN
INTRAMUSCULAR | Status: AC
  Filled 2017-06-02: qty 2

## 2017-06-02 MED ORDER — MAGNESIUM CITRATE PO SOLN: 1.0000 | Freq: Once | ORAL | Status: DC | PRN

## 2017-06-02 MED ORDER — LACTATED RINGERS IV SOLN: INTRAVENOUS | Status: DC

## 2017-06-02 MED ORDER — HYDROMORPHONE HCL 1 MG/ML IJ SOLN
INTRAMUSCULAR | Status: DC | PRN
  Administered 2017-06-02: 0.5 mg via INTRAVENOUS

## 2017-06-02 MED ORDER — BUPIVACAINE-EPINEPHRINE (PF) 0.25% -1:200000 IJ SOLN
INTRAMUSCULAR | Status: AC
  Filled 2017-06-02: qty 30

## 2017-06-02 MED ORDER — MUPIROCIN 2 % EX OINT: 1.0000 "application " | TOPICAL_OINTMENT | Freq: Once | CUTANEOUS | Status: DC

## 2017-06-02 MED ORDER — METHOCARBAMOL 500 MG PO TABS: 500.0000 mg | ORAL_TABLET | Freq: Three times a day (TID) | ORAL | 0 refills | Status: DC

## 2017-06-02 MED ORDER — POLYETHYLENE GLYCOL 3350 17 G PO PACK: 17.0000 g | PACK | Freq: Every day | ORAL | Status: DC | PRN

## 2017-06-02 MED ORDER — ONDANSETRON HCL 4 MG PO TABS: 4.0000 mg | ORAL_TABLET | Freq: Four times a day (QID) | ORAL | Status: DC | PRN

## 2017-06-02 MED ORDER — LIDOCAINE HCL (CARDIAC) PF 100 MG/5ML IV SOSY
PREFILLED_SYRINGE | INTRAVENOUS | Status: DC | PRN
  Administered 2017-06-02: 60 mg via INTRAVENOUS

## 2017-06-02 MED ORDER — ROCURONIUM BROMIDE 10 MG/ML (PF) SYRINGE
PREFILLED_SYRINGE | INTRAVENOUS | Status: AC
  Filled 2017-06-02: qty 5

## 2017-06-02 MED ORDER — PHENOL 1.4 % MT LIQD: 1.0000 | OROMUCOSAL | Status: DC | PRN

## 2017-06-02 MED ORDER — OXYCODONE HCL 5 MG PO TABS
10.0000 mg | ORAL_TABLET | ORAL | Status: DC | PRN
  Administered 2017-06-02 – 2017-06-03 (×2): 10 mg via ORAL
  Filled 2017-06-02 (×2): qty 2

## 2017-06-02 MED ORDER — FENTANYL CITRATE (PF) 100 MCG/2ML IJ SOLN
INTRAMUSCULAR | Status: DC | PRN
  Administered 2017-06-02 (×2): 50 ug via INTRAVENOUS
  Administered 2017-06-02: 100 ug via INTRAVENOUS
  Administered 2017-06-02: 50 ug via INTRAVENOUS

## 2017-06-02 MED ORDER — ACETAMINOPHEN 650 MG RE SUPP: 650.0000 mg | RECTAL | Status: DC | PRN

## 2017-06-02 MED ORDER — MENTHOL 3 MG MT LOZG: 1.0000 | LOZENGE | OROMUCOSAL | Status: DC | PRN

## 2017-06-02 MED ORDER — SODIUM CHLORIDE 0.9% FLUSH
3.0000 mL | Freq: Two times a day (BID) | INTRAVENOUS | Status: DC
  Administered 2017-06-02: 3 mL via INTRAVENOUS

## 2017-06-02 MED ORDER — THROMBIN (RECOMBINANT) 20000 UNITS EX SOLR
CUTANEOUS | Status: AC
  Filled 2017-06-02: qty 20000

## 2017-06-02 MED ORDER — 0.9 % SODIUM CHLORIDE (POUR BTL) OPTIME
TOPICAL | Status: DC | PRN
  Administered 2017-06-02: 1000 mL

## 2017-06-02 MED ORDER — PROPOFOL 10 MG/ML IV BOLUS
INTRAVENOUS | Status: DC | PRN
  Administered 2017-06-02: 10 mg via INTRAVENOUS
  Administered 2017-06-02: 30 mg via INTRAVENOUS
  Administered 2017-06-02: 150 mg via INTRAVENOUS

## 2017-06-02 MED ORDER — CEFAZOLIN SODIUM-DEXTROSE 2-4 GM/100ML-% IV SOLN
2.0000 g | INTRAVENOUS | Status: AC
  Administered 2017-06-02: 2 g via INTRAVENOUS

## 2017-06-02 MED ORDER — MIDAZOLAM HCL 2 MG/2ML IJ SOLN
INTRAMUSCULAR | Status: AC
  Filled 2017-06-02: qty 2

## 2017-06-02 MED ORDER — CEFAZOLIN SODIUM-DEXTROSE 2-4 GM/100ML-% IV SOLN: 2.0000 g | INTRAVENOUS | Status: DC

## 2017-06-02 MED ORDER — ACETAMINOPHEN 10 MG/ML IV SOLN
1000.0000 mg | Freq: Once | INTRAVENOUS | Status: AC
  Administered 2017-06-02: 1000 mg via INTRAVENOUS
  Filled 2017-06-02: qty 100

## 2017-06-02 MED ORDER — OXYCODONE-ACETAMINOPHEN 10-325 MG PO TABS: 1.0000 | ORAL_TABLET | Freq: Four times a day (QID) | ORAL | 0 refills | Status: AC | PRN

## 2017-06-02 MED ORDER — DOCUSATE SODIUM 100 MG PO CAPS
100.0000 mg | ORAL_CAPSULE | Freq: Two times a day (BID) | ORAL | Status: DC
  Administered 2017-06-02 – 2017-06-03 (×2): 100 mg via ORAL
  Filled 2017-06-02 (×2): qty 1

## 2017-06-02 MED ORDER — SUGAMMADEX SODIUM 200 MG/2ML IV SOLN
INTRAVENOUS | Status: AC
Start: 2017-06-02 — End: ?
  Filled 2017-06-02: qty 2

## 2017-06-02 MED ORDER — FENTANYL CITRATE (PF) 100 MCG/2ML IJ SOLN
25.0000 ug | INTRAMUSCULAR | Status: DC | PRN
  Administered 2017-06-02: 25 ug via INTRAVENOUS

## 2017-06-02 MED ORDER — FENTANYL CITRATE (PF) 100 MCG/2ML IJ SOLN: 25.0000 ug | INTRAMUSCULAR | Status: DC | PRN

## 2017-06-02 MED ORDER — METHYLPREDNISOLONE ACETATE 40 MG/ML IJ SUSP
INTRAMUSCULAR | Status: DC | PRN
  Administered 2017-06-02: 40 mg

## 2017-06-02 MED ORDER — ONDANSETRON 4 MG PO TBDP: 4.0000 mg | ORAL_TABLET | Freq: Three times a day (TID) | ORAL | 0 refills | Status: DC | PRN

## 2017-06-02 MED ORDER — ONDANSETRON HCL 4 MG/2ML IJ SOLN
INTRAMUSCULAR | Status: DC | PRN
Start: 2017-06-02 — End: 2017-06-02
  Administered 2017-06-02: 4 mg via INTRAVENOUS

## 2017-06-02 MED ORDER — DEXAMETHASONE SODIUM PHOSPHATE 10 MG/ML IJ SOLN
INTRAMUSCULAR | Status: AC
Start: 2017-06-02 — End: ?
  Filled 2017-06-02: qty 1

## 2017-06-02 MED ORDER — SODIUM CHLORIDE 0.9 % IV SOLN
250.0000 mL | INTRAVENOUS | Status: DC
  Administered 2017-06-03: 250 mL via INTRAVENOUS

## 2017-06-02 MED ORDER — HYDROMORPHONE HCL 1 MG/ML IJ SOLN
INTRAMUSCULAR | Status: AC
  Filled 2017-06-02: qty 0.5

## 2017-06-02 MED ORDER — METHYLPREDNISOLONE ACETATE 40 MG/ML IJ SUSP
INTRAMUSCULAR | Status: AC
  Filled 2017-06-02: qty 1

## 2017-06-02 MED ORDER — MUPIROCIN 2 % EX OINT
TOPICAL_OINTMENT | CUTANEOUS | Status: AC
  Filled 2017-06-02: qty 22

## 2017-06-02 MED ORDER — OXYCODONE HCL 5 MG PO TABS
5.0000 mg | ORAL_TABLET | ORAL | Status: DC | PRN
  Administered 2017-06-03: 5 mg via ORAL
  Filled 2017-06-02 (×2): qty 1

## 2017-06-02 SURGICAL SUPPLY — 61 items
BLADE CLIPPER SURG (BLADE) IMPLANT
BNDG GAUZE ELAST 4 BULKY (GAUZE/BANDAGES/DRESSINGS) ×2 IMPLANT
BUR EGG ELITE 4.0 (BURR) ×2 IMPLANT
BUR MATCHSTICK NEURO 3.0 LAGG (BURR) ×2 IMPLANT
CANISTER SUCT 3000ML PPV (MISCELLANEOUS) ×2 IMPLANT
CORDS BIPOLAR (ELECTRODE) ×2 IMPLANT
COVER SURGICAL LIGHT HANDLE (MISCELLANEOUS) ×2 IMPLANT
DERMABOND ADVANCED (GAUZE/BANDAGES/DRESSINGS)
DERMABOND ADVANCED .7 DNX12 (GAUZE/BANDAGES/DRESSINGS) IMPLANT
DRAIN PENROSE 1/4X12 LTX STRL (WOUND CARE) IMPLANT
DRAPE HALF SHEET 40X57 (DRAPES) ×2 IMPLANT
DRAPE POUCH INSTRU U-SHP 10X18 (DRAPES) ×2 IMPLANT
DRAPE SURG 17X23 STRL (DRAPES) ×2 IMPLANT
DRAPE U-SHAPE 47X51 STRL (DRAPES) ×2 IMPLANT
DRSG AQUACEL AG ADV 3.5X10 (GAUZE/BANDAGES/DRESSINGS) ×2 IMPLANT
DRSG EMULSION OIL 3X3 NADH (GAUZE/BANDAGES/DRESSINGS) IMPLANT
DRSG OPSITE POSTOP 4X6 (GAUZE/BANDAGES/DRESSINGS) ×2 IMPLANT
DURAPREP 26ML APPLICATOR (WOUND CARE) ×2 IMPLANT
ELECT BLADE 4.0 EZ CLEAN MEGAD (MISCELLANEOUS)
ELECT CAUTERY BLADE 6.4 (BLADE) ×2 IMPLANT
ELECT PENCIL ROCKER SW 15FT (MISCELLANEOUS) ×2 IMPLANT
ELECT REM PT RETURN 9FT ADLT (ELECTROSURGICAL) ×2
ELECTRODE BLDE 4.0 EZ CLN MEGD (MISCELLANEOUS) IMPLANT
ELECTRODE REM PT RTRN 9FT ADLT (ELECTROSURGICAL) ×1 IMPLANT
FLOSEAL (HEMOSTASIS) ×2 IMPLANT
GLOVE BIO SURGEON STRL SZ 6.5 (GLOVE) ×2 IMPLANT
GLOVE BIOGEL PI IND STRL 6.5 (GLOVE) ×1 IMPLANT
GLOVE BIOGEL PI IND STRL 8.5 (GLOVE) ×1 IMPLANT
GLOVE BIOGEL PI INDICATOR 6.5 (GLOVE) ×1
GLOVE BIOGEL PI INDICATOR 8.5 (GLOVE) ×1
GLOVE SS BIOGEL STRL SZ 8.5 (GLOVE) ×1 IMPLANT
GLOVE SUPERSENSE BIOGEL SZ 8.5 (GLOVE) ×1
GOWN STRL REUS W/ TWL LRG LVL3 (GOWN DISPOSABLE) ×1 IMPLANT
GOWN STRL REUS W/TWL 2XL LVL3 (GOWN DISPOSABLE) ×2 IMPLANT
GOWN STRL REUS W/TWL LRG LVL3 (GOWN DISPOSABLE) ×1
KIT BASIN OR (CUSTOM PROCEDURE TRAY) ×2 IMPLANT
KIT TURNOVER KIT B (KITS) ×2 IMPLANT
NEEDLE 22X1 1/2 (OR ONLY) (NEEDLE) ×2 IMPLANT
NEEDLE SPNL 18GX3.5 QUINCKE PK (NEEDLE) ×4 IMPLANT
NS IRRIG 1000ML POUR BTL (IV SOLUTION) ×2 IMPLANT
PACK LAMINECTOMY ORTHO (CUSTOM PROCEDURE TRAY) ×2 IMPLANT
PACK UNIVERSAL I (CUSTOM PROCEDURE TRAY) ×2 IMPLANT
PAD ARMBOARD 7.5X6 YLW CONV (MISCELLANEOUS) ×4 IMPLANT
PATTIES SURGICAL .5 X.5 (GAUZE/BANDAGES/DRESSINGS) IMPLANT
PATTIES SURGICAL .5 X1 (DISPOSABLE) IMPLANT
SPONGE SURGIFOAM ABS GEL 100 (HEMOSTASIS) ×2 IMPLANT
STAPLER VISISTAT 35W (STAPLE) IMPLANT
STRIP CLOSURE SKIN 1/2X4 (GAUZE/BANDAGES/DRESSINGS) ×2 IMPLANT
SURGIFLO W/THROMBIN 8M KIT (HEMOSTASIS) IMPLANT
SUT BONE WAX W31G (SUTURE) ×2 IMPLANT
SUT MON AB 3-0 SH 27 (SUTURE) ×1
SUT MON AB 3-0 SH27 (SUTURE) ×1 IMPLANT
SUT VIC AB 1 CT1 27 (SUTURE) ×1
SUT VIC AB 1 CT1 27XBRD ANBCTR (SUTURE) ×1 IMPLANT
SUT VIC AB 2-0 CT1 18 (SUTURE) ×2 IMPLANT
SYR BULB IRRIGATION 50ML (SYRINGE) ×2 IMPLANT
SYR CONTROL 10ML LL (SYRINGE) ×2 IMPLANT
TOWEL OR 17X24 6PK STRL BLUE (TOWEL DISPOSABLE) ×2 IMPLANT
TOWEL OR 17X26 10 PK STRL BLUE (TOWEL DISPOSABLE) ×2 IMPLANT
WATER STERILE IRR 1000ML POUR (IV SOLUTION) ×2 IMPLANT
YANKAUER SUCT BULB TIP NO VENT (SUCTIONS) ×2 IMPLANT

## 2017-06-02 NOTE — H&P (Addendum)
Patient ID: Leslie Sullivan MRN: [id number] DOB/AGE: 1970-02-15 47 y.o.  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  The pt presented to the ED with increasing lumbar and leg pain.  The pt was concerned she was having bowel incontinence.  She reports an incidence last evening and this morning that the urge to have a BM hit her fast and she barely made it to the bathroom.  She denied urinary incontinence.  The reports a hx of good health.   Past Medical History: Past Medical History:  Diagnosis Date  . Breast cancer ([place])    Left, chemo 09/2010-03/2011, no radiation, bilateral mastectomy  . Chicken pox   . Ectopic pregnancy, tubal about 1996, 1998   x2  . Hypertension     Surgical History: Past Surgical History:  Procedure Laterality Date  . BLADDER SUSPENSION  03/2010   mesh, with hyst in 2012  . BREAST ENHANCEMENT SURGERY Bilateral 05/2011   implants following mastectomy  . [place]   tubes removed in 1996  . LAPAROSCOPIC ASSISTED VAGINAL HYSTERECTOMY  07/22/2010   pelvic organ prolapse, cystocele, LOA with Transobrurator tape procedure - ovaries remain, tubes previously removed  . MASTECTOMY Bilateral 10/2010      . TUBAL LIGATION      Family History: Family History  Problem Relation Age of Onset  . Lung cancer Maternal Grandmother        smoker  . Diabetes Paternal Grandmother   . Heart attack Paternal Grandfather        before 4 years old  . Alzheimer's disease Paternal Grandfather   . Thyroid disease Mother   . Thyroid disease Sister     Social History: Social History   Socioeconomic History  . Marital status: Married    Spouse name: Not on file  . Number of children: 1  . Years of education: 29  . Highest education level: Not on file  Occupational History  . Occupation:  Press photographer Rep  Social Needs  . Financial resource strain: Not on file  . Food insecurity:    Worry: Not on file    Inability: Not on file  . Transportation needs:    Medical: Not on file    Non-medical: Not on file  Tobacco Use  . Smoking status: Never Smoker  . Smokeless tobacco: Never Used  Substance and Sexual Activity  . Alcohol use: Yes    Alcohol/week: 0.0 oz    Comment: socially  . Drug use: No  . Sexual activity: Yes    Partners: Male    Birth control/protection: Surgical    Comment: hysterectomy  Lifestyle  . Physical activity:    Days per week: Not on file    Minutes per session: Not on file  . Stress: Not on file  Relationships  . Social connections:    Talks on phone: Not on file    Gets together: Not on file    Attends religious service: Not on file    Active member of club or organization: Not on file    Attends meetings of clubs or organizations: Not on file    Relationship status: Not on file  . Intimate partner violence:    Fear of current or ex partner: Not on file    Emotionally abused: Not on file  Physically abused: Not on file    Forced sexual activity: Not on file  Other Topics Concern  . Not on file  Social History Narrative   Born and raised in [place]. Currently resides in a condo with her husband. 1 dog. Fun: Everything   Denies religious beliefs effecting health care.     Allergies: Patient has no known allergies.  Medications: I have reviewed the patient's current medications.  Vital Signs: Patient Vitals for the past 24 hrs:  BP Temp Temp src Pulse Resp SpO2  06/02/17 0920 (!) 142/69 99.6 F (37.6 C) Oral 98 18 96 %    Radiology: No results found.  Labs: No results for input(s): WBC, RBC, HCT, PLT in the last 72 hours. No results for input(s): NA, K, CL, CO2, BUN, CREATININE, GLUCOSE, CALCIUM in the last 72 hours. No results for input(s): LABPT, INR in the last 72 hours.  Review of Systems: ROS  Physical Exam: There  is no height or weight on file to calculate BMI.  Physical Exam  Constitutional: She is oriented to person, place, and time. She appears well-developed and well-nourished.  HENT:  Head: Normocephalic.  Neck: Normal range of motion.  Cardiovascular: Normal rate and regular rhythm.  Respiratory: Effort normal and breath sounds normal.  GI: Soft. Bowel sounds are normal.  Neurological: She is alert and oriented to person, place, and time.  Skin: Skin is warm and dry.  Psychiatric: She has a normal mood and affect. Her behavior is normal. Judgment and thought content normal.   TTP of the Lumbar paraspinals right greater than left.Positive right straight leg raise.  Ambulatory deficits noted, 4/5 gastrocnemius strength, 1+ reflexes and symmetric, compartments are soft and nontender . Rectal exam was performed on pt in the ED.  Good sphincter tone was notes as well as sharp dull senstion  MRI from 05/2017 is positive for right sided disc protrusion with compression of the S1 nerve  Assessment and Plan:  Because of the pts increasing pain/symptoms  the decision was made to move forward with the surgery today.  Risks and benefits of surgery were discussed with the patient.  These include: Infection, bleeding, death, stroke, paralysis, ongoing or worse pain, need for additional surgery, leak of spinal fluid, adjacent segment degeneration requiring additional surgery, post-operative hematoma formation that can result in neurological compromise and the need for urgent/emergent re-operation.  Loss in bowel and bladder control. Injury to major vessels that could result in the need for urgent abdominal surgery to stop bleeding.  Risk of deep venous thrombosis (DVT) and the need for additional treatment. Recurrent disc herniation resulting in the need for revision surgery, which could include fusion surgery (utilizing instrumentation such as pedicle screws and intervertebral cages).  Additional risk: If  instrumentation is used to address spinal stenosis there is a risk of migration, or breakage of that hardware that could require additional surgery.  Goal of surgery: Reduce (not eliminate) pain, and improve quality of life.     Ronette Deter for Melina Schools, MD [facility] [phone]  Leslie Sullivan is a very pleasant 47 year old woman who presented to my care as an outpatient with severe radicular leg pain.  Imaging studies confirmed a large right-sided L5-S1 disc herniation causing significant compression of the S1 nerve root.  The patient presented today to the emergency room because of an episode of incontinence of bowel and sharp increase in her pain and dysesthesias in the right lower extremity.  Clinical examination in the emergency  department done by myself: She had intact sphincter tone and perianal sensation to light touch was also intact.  Patient had a positive straight leg raise test, as well as contralateral straight leg raise test.  Patient had 3+ out of 5 gastrocnemius strength, and increasing dense dysesthesias in the right S1 dermatome.  Because of this sharp increase in her pain, and the progressive neurological deficit compared to her evaluation 3 days ago we elected to admit the patient for an urgent lumbar discectomy decompression.  Plan is to take her to the operating room today for a L5-S1 microdiscectomy on the right side.  I did go over the risks and benefits with the patient and her husband and all their questions were addressed.  We will plan on discharge in the morning.

## 2017-06-02 NOTE — Op Note (Signed)
Operative report  Preoperative diagnosis: Large right L5-S1 disc herniation with S1 radiculopathy  Postoperative diagnosis: Same  Operative procedure laminotomy for discectomy L5-S1 right side (CPT: 95621)  First assistant: Ronette Deter, PA  Intraoperative findings: Large L5-S1 right-sided disc herniation consistent with a preoperative MRI.  After decompression/discectomy no CSF leak noted.  S1 nerve root no longer under compression.  Indications: This is a very pleasant 47 year old woman who presented my office with acute severe onset of right radicular leg pain, numbness, and trace weakness.  MRI findings confirmed a large disc herniation with significant neural compression.  Patient had acute decompensation this morning with an episode of incontinence of bowel and worsening weakness and dysesthesias and pain.  As a result she presented to the emergency room where she was admitted for definitive surgical management.  Operative report.  Patient was brought the operating room placed on the operating table.  After successful induction of general anesthesia and endotracheally patient teds SCDs were applied and she was turned prone onto the Wilson frame.  All bony prominences well-padded and the back was prepped and draped in standard fashion.  Timeout was taken to confirm patient procedure and all other important data.  2 needles were placed into the back and x-ray was taken localized the incision site.  Incision was marked and then infiltrated with quarter percent Marcaine with epinephrine.  Midline incision was made sharp dissection was carried out down to the deep fascia.  The deep fascia was sharply incised and using a Cobb elevator and Bovie I strip the paraspinal muscles to expose the spinous process and lamina of L5 and S1.  A Penfield 4 was placed under the L5 lamina and an x-ray was taken to confirm I was at the appropriate level.  Once this was confirmed a Immunologist was placed along  the lateral side of the facet complex and held in place for visualization.  A laminotomy of L4-5 was performed using a 3 mm Kerrison Roger and a medial facetectomy was performed using a 2 mm Kerrison rongeur.  I then dissected through the ligamentum flavum Penfield 4 and then used by 2 mm Kerrison punch to remove the ligamentum flavum and exposed the posterior lateral aspect of the thecal sac.  At this point I noted significant distortion of the thecal sac and the S1 nerve root.  I could visualize the large fragment of disc material in the lateral recess consistent with what was seen on the MRI.  At this point in order to confirm anatomical planes I gently dissected in the lateral recess using a Penfield 4 until I could palpate the superior and medial portion of the S1 pedicle.  Once I was able to visualize this I was able to sweep inferiorly and identified the S1 nerve root.  Once they visualize the S1 nerve root I could then gently retract the thecal sac and completely exposed the large fragment of disc material.  Once this was done I was able to use my nerve hook to break up the disc fragment and then remove it with pituitary rongeurs.  3 very large fragments of disc material.  At this point I used my round nerve hook tissue begins sweeping along the annulus medially to remove 2 more fragments of disc material.  At this point I identified the annular rent and used my micropituitary rongeur to enter into the disc space and gently resect any loose fragments of disc that were still within the intervertebral space.  At this  point the S1 nerve root was no longer dorsally displaced and under tension.  I could freely visualize the S1 nerve root from just above the L5-S1 disc space across the disc space and into the S1 foramen.  The nerve root itself was under no tension and was freely mobile.  I could also use my [facility] and pass it medially along the annulus is confirming adequate decompression.  At this  point I was pleased with the decompression.  Using bipolar cautery and FloSeal I obtained and maintained hemostasis.  I then irrigated the wound copious leak with normal saline and make sure that there was no active bleeding.  But because of the size of the disc fragment and the actual progression in her symptoms I did elect to place him Depo-Medrol over the nerve for postoperative analgesia.  I used a total of 1/2 cc (20 mg) of Depo-Medrol.  Thrombin-soaked Gelfoam patty was then placed over the laminotomy site and I closed the wound in layered fashion.  Utilizing interrupted #1 Vicryl suture, 0 Vicryl suture, 2-0 Vicryl suture, and 3-0 Monocryl for skin.  Steri-Strips dry dressing were applied and the patient was ultimately extubated transferred to PACU 33.  The end of the case all needle sponge counts were correct.

## 2017-06-02 NOTE — Brief Op Note (Signed)
06/02/2017  5:22 PM  PATIENT:  Leslie Sullivan  47 y.o. female  PRE-OPERATIVE DIAGNOSIS:  Right L5-S1 herniated nucleus pulposis  POST-OPERATIVE DIAGNOSIS:  Right L5-S1 herniated nucleus pulposis  PROCEDURE:  Procedure(s): RIGHT L5-S1 DISCECTOMY (Right)  SURGEON:  Surgeon(s) and Role:    Melina Schools, MD - Primary  PHYSICIAN ASSISTANT:   ASSISTANTS: Carmen Mayo   ANESTHESIA:   general  EBL:  25 mL   BLOOD ADMINISTERED:none  DRAINS: none   LOCAL MEDICATIONS USED:  MARCAINE     SPECIMEN:  No Specimen  DISPOSITION OF SPECIMEN:  N/A  COUNTS:  YES  TOURNIQUET:  * No tourniquets in log *  DICTATION: .Dragon Dictation  PLAN OF CARE: Admit for overnight observation  PATIENT DISPOSITION:  PACU - hemodynamically stable.

## 2017-06-02 NOTE — Transfer of Care (Signed)
Immediate Anesthesia Transfer of Care Note  Patient: Leslie Sullivan  Procedure(s) Performed: RIGHT L5-S1 DISCECTOMY (Right )  Patient Location: PACU  Anesthesia Type:General  Level of Consciousness: patient cooperative and responds to stimulation  Airway & Oxygen Therapy: Patient Spontanous Breathing and Patient connected to nasal cannula oxygen  Post-op Assessment: Report given to RN and Post -op Vital signs reviewed and stable  Post vital signs: Reviewed and stable  Last Vitals:  Vitals Value Taken Time  BP 137/73 06/02/2017  5:37 PM  Temp    Pulse 78 06/02/2017  5:40 PM  Resp 13 06/02/2017  5:40 PM  SpO2 100 % 06/02/2017  5:40 PM  Vitals shown include unvalidated device data.  Last Pain:  Vitals:   06/02/17 0931  TempSrc:   PainSc: 10-Worst pain ever         Complications: No apparent anesthesia complications

## 2017-06-02 NOTE — Anesthesia Preprocedure Evaluation (Addendum)
Anesthesia Evaluation  Patient identified by MRN, date of birth, ID band Patient awake and Patient confused    Reviewed: Allergy & Precautions, NPO status , Patient's Chart, lab work & pertinent test results  Airway Mallampati: II  TM Distance: >3 FB Neck ROM: Full    Dental  (+) Teeth Intact, Dental Advisory Given   Pulmonary    breath sounds clear to auscultation       Cardiovascular hypertension,  Rhythm:Regular Rate:Normal     Neuro/Psych    GI/Hepatic negative GI ROS,   Endo/Other  Morbid obesity  Renal/GU      Musculoskeletal   Abdominal   Peds  Hematology   Anesthesia Other Findings   Reproductive/Obstetrics                           Anesthesia Physical Anesthesia Plan  ASA: II  Anesthesia Plan: General   Post-op Pain Management:    Induction: Intravenous  PONV Risk Score and Plan: 1 and Ondansetron and Dexamethasone  Airway Management Planned: Oral ETT  Additional Equipment:   Intra-op Plan:   Post-operative Plan: Extubation in OR  Informed Consent: I have reviewed the patients History and Physical, chart, labs and discussed the procedure including the risks, benefits and alternatives for the proposed anesthesia with the patient or authorized representative who has indicated his/her understanding and acceptance.   Dental advisory given  Plan Discussed with: CRNA and Anesthesiologist  Anesthesia Plan Comments:         Anesthesia Quick Evaluation

## 2017-06-02 NOTE — ED Triage Notes (Signed)
Pt presents for evaluation of worsening numbness to upper groin and lower back area. Pt also had bowel and bladder incontinence last night and this AM. Pt is due to have back sx on Monday but was told by dr to come today.

## 2017-06-02 NOTE — Discharge Instructions (Signed)
Lumbar Diskectomy Lumbar diskectomy is a surgical procedure that treats low back pain that is caused by an injured disk. Disks are cushions in your spine that are between your spinal bones (vertebrae). When a disk ruptures or pushes out of its normal space (herniates), it can cause pressure on your spinal cord or the nerves that leave your spinal cord. This can cause back pain, numbness, or weakness. You may need this procedure if other treatments have not worked. Tell a health care provider about:  Any allergies you have.  All medicines you are taking, including vitamins, herbs, eye drops, creams, and over-the-counter medicines.  Any problems you or family members have had with anesthetic medicines.  Any blood disorders you have.  Any surgeries you have had.  Any medical conditions you have. What are the risks? Generally, this is a safe procedure. However, problems may occur, including:  Bleeding.  Infection.  Damage to the nerve or spinal cord.  Worsening pain.  Failure to relieve your symptoms.  What happens before the procedure?  You may need an imaging study done of your spine to help plan the procedure.  Follow instructions from your health care provider about eating or drinking restrictions.  Ask your health care provider about: ? Changing or stopping your regular medicines. This is especially important if you are taking diabetes medicines or blood thinners. ? Taking medicines such as aspirin and ibuprofen. These medicines can thin your blood. Do not take these medicines before your procedure if your health care provider instructs you not to.  Do not drink alcohol.  Do not use any tobacco products, including cigarettes, chewing tobacco, and electronic cigarettes. If you need help quitting, ask your health care provider.  Plan to have someone take you home after the procedure.  If you go home right after the procedure, plan to have someone with you for 24  hours. What happens during the procedure?  An IV tube will be inserted into one of your veins.  You will be given one or more of the following: ? A medicine that helps you relax (sedative). ? A medicine that numbs the area (local anesthetic). ? A medicine that makes you fall asleep (general anesthetic). ? A medicine that is injected into your spine that numbs the area below and slightly above the injection site (spinal anesthetic).  You will be positioned face-down on the operating table.  Your lower back will be cleaned with a germ-killing (antiseptic) solution.  Your surgeon will make an incision over your spine.  The muscles and nerves over your spine will be moved aside so your vertebrae and injured disk can be seen easily. Your surgeon may use a specific type of operating microscope.  Your surgeon may need to remove some connecting tissue (ligaments) or pieces of bone to get to your disk.  Your surgeon will remove the part of the disk that is causing your symptoms.  The muscles and nerves will be put back in their normal position.  The incision will be closed with stitches (sutures) or staples. A bandage (dressing) will be placed over the incision. The procedure may vary among health care providers and hospitals. What happens after the procedure?  Your blood pressure, heart rate, breathing rate, and blood oxygen level will be monitored often until the medicines you were given have worn off.  Your IV tube can be taken out when you are able to drink fluids on your own.  You will be encouraged to get up and   walk around as soon as you can.  You may meet with a physical therapist to talk about recovering at home. This information is not intended to replace advice given to you by your health care provider. Make sure you discuss any questions you have with your health care provider. Document Released: 06/12/2014 Document Revised: 07/04/2015 Document Reviewed: 01/03/2014 Elsevier  Interactive Patient Education  2018 [employer]  

## 2017-06-02 NOTE — Anesthesia Procedure Notes (Signed)

## 2017-06-03 MED ORDER — GABAPENTIN 300 MG PO CAPS
600.0000 mg | ORAL_CAPSULE | Freq: Three times a day (TID) | ORAL | Status: DC
  Filled 2017-06-03: qty 2

## 2017-06-03 NOTE — Progress Notes (Signed)
Discharge instructions (including medications) discussed with and copy provided to patient/caregiver along with paper prescription. 

## 2017-06-03 NOTE — Progress Notes (Signed)
    Subjective: 1 Day Post-Op Procedure(s) (LRB): RIGHT L5-S1 DISCECTOMY (Right) Patient reports pain as 1 on 0-10 scale.   Denies CP or SOB.  Voiding without difficulty. Positive flatus. Objective: Vital signs in last 24 hours: Temp:  [97.3 F (36.3 C)-99.6 F (37.6 C)] 98 F (36.7 C) (04/25 0403) Pulse Rate:  [66-98] 66 (04/25 0403) Resp:  [10-20] 18 (04/25 0403) BP: (110-142)/(64-92) 131/71 (04/25 0403) SpO2:  [94 %-100 %] 95 % (04/25 0403) Weight:  [101.2 kg (223 lb)] 101.2 kg (223 lb) (04/24 1318)  Intake/Output from previous day: 04/24 0701 - 04/25 0700 In: 2020.3 [P.O.:240; I.V.:1780.3] Out: 25 [Blood:25] Intake/Output this shift: No intake/output data recorded.  Labs: Recent Labs    06/02/17 1251  HGB 13.9   Recent Labs    06/02/17 1251  WBC 7.3  RBC 4.67  HCT 43.0  PLT 258   Recent Labs    06/02/17 1251  NA 140  K 4.1  CL 103  CO2 27  BUN 11  CREATININE 0.60  GLUCOSE 81  CALCIUM 9.7   No results for input(s): LABPT, INR in the last 72 hours.  Physical Exam: Neurologically intact ABD soft Sensation intact distally Dorsiflexion/Plantar flexion intact Incision: no drainage Compartment soft Body mass index is 35.45 kg/m.   Assessment/Plan: 1 Day Post-Op Procedure(s) (LRB): RIGHT L5-S1 DISCECTOMY (Right) Advance diet Up with therapy  Pt may DC after cleared by PT Post op medication is on chart Pt will present to clinic in 2 weeks  Mayo, Darla Lesches for Dr. Melina Schools [facility] [phone] 06/03/2017, 7:33 AM

## 2017-06-03 NOTE — Progress Notes (Signed)
Nurse went over discharge with patient. Patient and family verbalized understanding of discharge. All questions and concerns addressed. Discharging home with all belongs. Taking down in a wheelchair.

## 2017-06-03 NOTE — Care Management Note (Signed)
Case Management Note  Patient Details  Name: Leslie Sullivan MRN: [id number] Date of Birth: [birth year]  Subjective/Objective:   Pt s/p laminectomy. She is from home with spouse.               Action/Plan: Pt discharging home with self care. Pt has PCP, insurance and transportation home.   Expected Discharge Date:  06/03/17               Expected Discharge Plan:  Home/Self Care  In-House Referral:     Discharge planning Services     Post Acute Care Choice:    Choice offered to:     DME Arranged:    DME Agency:     HH Arranged:    HH Agency:     Status of Service:  Completed, signed off  If discussed at [employer] of Stay Meetings, dates discussed:    Additional Comments:  Pollie Friar, RN 06/03/2017, 10:42 AM

## 2017-06-03 NOTE — Progress Notes (Signed)
Orthopedic Tech Progress Note Patient Details:  Leslie Sullivan 06-Oct-1970 [id number]  Patient ID: [name], female   DOB: 21-Feb-1970, 47 y.o.   MRN: [id number]   [place] Pink 06/03/2017, 9:21 AMCalled [employer] for Lumbar brace.

## 2017-06-03 NOTE — Evaluation (Signed)
Physical Therapy Evaluation Patient Details Name: Leslie Sullivan MRN: [id number] DOB: 08/19/1970 Today's Date: 06/03/2017   History of Present Illness  Pt is a 47 y.o. female s/p laminectomy and discectomy L5-S1.  Clinical Impression  PT eval complete. Pt is modified independent with bed mobility, transfers and ambulation 250 feet. Supervision provided for ascend/descend flight of stairs. All education complete. No further PT intervention indicated. No DME needed. Pt to d/c home today. PT signing off.    Follow Up Recommendations No PT follow up;Supervision - Intermittent    Equipment Recommendations  None recommended by PT    Recommendations for Other Services       Precautions / Restrictions Precautions Precautions: Back Precaution Comments: Educated pt on 3/3 back precautions.  Required Braces or Orthoses: Spinal Brace Spinal Brace: Lumbar corset;Applied in sitting position      Mobility  Bed Mobility Overal bed mobility: Modified Independent             General bed mobility comments: HOB flat, no rails, verbal cues for logroll  Transfers Overall transfer level: Modified independent Equipment used: None                Ambulation/Gait Ambulation/Gait assistance: Modified independent (Device/Increase time) Ambulation Distance (Feet): 250 Feet Assistive device: None Gait Pattern/deviations: WFL(Within Functional Limits) Gait velocity: WFL Gait velocity interpretation: >2.62 ft/sec, indicative of community ambulatory    Stairs Stairs: Yes Stairs assistance: Supervision Stair Management: One rail Left;Alternating pattern Number of Stairs: 12 General stair comments: alternating pattern with ascent and step to pattern with descent  Wheelchair Mobility    Modified Rankin (Stroke Patients Only)       Balance Overall balance assessment: No apparent balance deficits (not formally assessed)                                            Pertinent Vitals/Pain Pain Assessment: No/denies pain    Home Living Family/patient expects to be discharged to:: Private residence Living Arrangements: Spouse/significant other;Children Available Help at Discharge: Family;Available 24 hours/day Type of Home: House Home Access: Stairs to enter Entrance Stairs-Rails: Psychiatric nurse of Steps: 6 Home Layout: Two level;Bed/bath upstairs Home Equipment: None      Prior Function Level of Independence: Independent               Hand Dominance        Extremity/Trunk Assessment   Upper Extremity Assessment Upper Extremity Assessment: Overall WFL for tasks assessed    Lower Extremity Assessment Lower Extremity Assessment: RLE deficits/detail RLE Deficits / Details: numbness lateral aspect of foot    Cervical / Trunk Assessment Cervical / Trunk Assessment: Normal  Communication   Communication: No difficulties  Cognition Arousal/Alertness: Awake/alert Behavior During Therapy: WFL for tasks assessed/performed Overall Cognitive Status: Within Functional Limits for tasks assessed                                        General Comments      Exercises     Assessment/Plan    PT Assessment Patent does not need any further PT services  PT Problem List         PT Treatment Interventions      PT Goals (Current goals can be found in the Care Plan section)  Acute Rehab PT Goals Patient Stated Goal: home today PT Goal Formulation: All assessment and education complete, DC therapy    Frequency     Barriers to discharge        Co-evaluation               AM-PAC PT "6 Clicks" Daily Activity  Outcome Measure Difficulty turning over in bed (including adjusting bedclothes, sheets and blankets)?: None Difficulty moving from lying on back to sitting on the side of the bed? : A Little Difficulty sitting down on and standing up from a chair with arms (e.g., wheelchair,  bedside commode, etc,.)?: None Help needed moving to and from a bed to chair (including a wheelchair)?: None Help needed walking in hospital room?: None Help needed climbing 3-5 steps with a railing? : None 6 Click Score: 23    End of Session Equipment Utilized During Treatment: Gait belt;Back brace Activity Tolerance: Patient tolerated treatment well Patient left: in bed;with call bell/phone within reach Nurse Communication: Mobility status PT Visit Diagnosis: Difficulty in walking, not elsewhere classified (R26.2)    Time: 0712-1975 PT Time Calculation (min) (ACUTE ONLY): 15 min   Charges:   PT Evaluation $PT Eval Moderate Complexity: 1 Mod     PT G Codes:        Lorrin Goodell, PT  Office # [phone] Pager [phone]   Lorriane Shire 06/03/2017, 10:15 AM

## 2017-06-03 NOTE — Evaluation (Signed)
Occupational Therapy Evaluation Patient Details Name: Leslie Sullivan MRN: [id number] DOB: 04/14/1970 Today's Date: 06/03/2017    History of Present Illness Pt is a 47 y.o. female s/p laminectomy and discectomy L5-S1.   Clinical Impression   Patient evaluated by Occupational Therapy with no further acute OT needs identified. All education has been completed and the patient has no further questions. Pt is mod I with ADLs.  See below for any follow-up Occupational Therapy or equipment needs. OT is signing off. Thank you for this referral.      Follow Up Recommendations  No OT follow up;Supervision - Intermittent    Equipment Recommendations  None recommended by OT    Recommendations for Other Services       Precautions / Restrictions Precautions Precautions: Back Precaution Comments: Educated pt on 3/3 back precautions.  Required Braces or Orthoses: Spinal Brace Spinal Brace: Lumbar corset;Applied in sitting position      Mobility Bed Mobility Overal bed mobility: Modified Independent             General bed mobility comments: Pt up ambulating in room   Transfers Overall transfer level: Modified independent Equipment used: None                  Balance Overall balance assessment: No apparent balance deficits (not formally assessed)                                         ADL either performed or assessed with clinical judgement   ADL Overall ADL's : Needs assistance/impaired Eating/Feeding: Independent   Grooming: Wash/dry hands;Wash/dry face;Oral care;Brushing hair;Modified independent;Standing Grooming Details (indicate cue type and reason): reviewed safe technique for brushing teeth  Upper Body Bathing: Modified independent;Standing   Lower Body Bathing: Modified independent;Sit to/from stand Lower Body Bathing Details (indicate cue type and reason): reviewed safe technique for LB bathing.  Also suggested use of LH brush  Upper Body  Dressing : Modified independent;Standing;Sitting   Lower Body Dressing: Modified independent;Sit to/from stand Lower Body Dressing Details (indicate cue type and reason): Pt does not wear socks.  She is able to cross Lt LE over knee, but not Rt LE over Lt knee.  Instructed her to don pants over Lt LE first.  Also instructed her in use of reacher  Toilet Transfer: Modified Independent;Ambulation;Comfort height toilet;Grab bars Toilet Transfer Details (indicate cue type and reason): has sink next to her toilet  Toileting- Clothing Manipulation and Hygiene: Modified independent;Sit to/from stand Toileting - Clothing Manipulation Details (indicate cue type and reason): reviewed safe technique.  Pt reports she used shower hose to clean off after toileting at home and will continue if necessary.  Discussed use and acquisition of toilet aid  Tub/ Shower Transfer: Walk-in shower;Modified independent;Ambulation;Shower seat   Functional mobility during ADLs: Modified independent General ADL Comments: reviewed safety with IADLs      Vision         Perception     Praxis      Pertinent Vitals/Pain Pain Assessment: No/denies pain     Hand Dominance     Extremity/Trunk Assessment Upper Extremity Assessment Upper Extremity Assessment: Overall WFL for tasks assessed   Lower Extremity Assessment Lower Extremity Assessment: Defer to PT evaluation RLE Deficits / Details: numbness lateral aspect of foot   Cervical / Trunk Assessment Cervical / Trunk Assessment: Normal   Communication Communication Communication: No difficulties  Cognition Arousal/Alertness: Awake/alert Behavior During Therapy: WFL for tasks assessed/performed Overall Cognitive Status: Within Functional Limits for tasks assessed                                     General Comments       Exercises     Shoulder Instructions      Home Living Family/patient expects to be discharged to:: Private  residence Living Arrangements: Spouse/significant other;Children Available Help at Discharge: Family;Available 24 hours/day Type of Home: House Home Access: Stairs to enter [employer] of Steps: 6 Entrance Stairs-Rails: Right;Left Home Layout: Two level;Bed/bath upstairs Alternate Level Stairs-Number of Steps: flight Alternate Level Stairs-Rails: Right;Left Bathroom Shower/Tub: Occupational psychologist: Standard     Home Equipment: None          Prior Functioning/Environment Level of Independence: Independent with assistive device(s)                 OT Problem List: Decreased knowledge of precautions      OT Treatment/Interventions:      OT Goals(Current goals can be found in the care plan section) Acute Rehab OT Goals Patient Stated Goal: home today OT Goal Formulation: All assessment and education complete, DC therapy Potential to Achieve Goals: Good  OT Frequency:     Barriers to D/C:            Co-evaluation              AM-PAC PT "6 Clicks" Daily Activity     Outcome Measure Help from another person eating meals?: None Help from another person taking care of personal grooming?: None Help from another person toileting, which includes using toliet, bedpan, or urinal?: None Help from another person bathing (including washing, rinsing, drying)?: None Help from another person to put on and taking off regular upper body clothing?: None Help from another person to put on and taking off regular lower body clothing?: None 6 Click Score: 24   End of Session Equipment Utilized During Treatment: Back brace Nurse Communication: Mobility status  Activity Tolerance: Patient tolerated treatment well Patient left: in bed;with call bell/phone within reach(sitting EOB )  OT Visit Diagnosis: Pain                Time: 3428-7681 OT Time Calculation (min): 17 min Charges:  OT General Charges $OT Visit: 1 Visit OT Evaluation $OT Eval Low  Complexity: 1 Low G-Codes:     [facility], OTR/L [phone]   Lucille Passy M 06/03/2017, 11:22 AM

## 2017-06-04 NOTE — Anesthesia Postprocedure Evaluation (Signed)
Anesthesia Post Note  Patient: Leslie Sullivan  Procedure(s) Performed: RIGHT L5-S1 DISCECTOMY (Right )     Patient location during evaluation: PACU Anesthesia Type: General Level of consciousness: awake and alert Pain management: pain level controlled Vital Signs Assessment: post-procedure vital signs reviewed and stable Respiratory status: spontaneous breathing, nonlabored ventilation and respiratory function stable Cardiovascular status: blood pressure returned to baseline and stable Postop Assessment: no apparent nausea or vomiting Anesthetic complications: no    Last Vitals:  Vitals:   06/03/17 0403 06/03/17 0800  BP: 131/71 138/79  Pulse: 66 80  Resp: 18 18  Temp: 36.7 C 36.6 C  SpO2: 95% 97%    Last Pain:  Vitals:   06/03/17 0800  TempSrc: Oral  PainSc:                  Yazlynn Birkeland,W. EDMOND

## 2017-06-05 ENCOUNTER — Encounter (HOSPITAL_COMMUNITY): Payer: Self-pay | Admitting: Orthopedic Surgery

## 2017-06-08 NOTE — Discharge Summary (Signed)
Physician Discharge Summary  Patient ID: Leslie Sullivan MRN: [id number] DOB/AGE: 02/13/1970 47 y.o.  Admit date: 06/02/2017 Discharge date: 06/03/17  Admission Diagnoses:  Right L5-S1 Disc Herniation  Discharge Diagnoses:  Active Problems:   S/P laminectomy   S/P lumbar microdiscectomy   Past Medical History:  Diagnosis Date  . Breast cancer ([place])    Left, chemo 09/2010-03/2011, no radiation, bilateral mastectomy  . Chicken pox   . Ectopic pregnancy, tubal about 1996, 1998   x2  . Hypertension     Surgeries: Procedure(s): RIGHT L5-S1 DISCECTOMY on 06/02/2017   Consultants (if any): Treatment Team:  Melina Schools, MD  Discharged Condition: Improved  Hospital Course: Leslie Sullivan is an 47 y.o. female who was admitted 06/02/2017 with a diagnosis of Right L5-S1 Disc Herniation and went to the operating room on 06/02/2017 and underwent the above named procedures.  Post op day 1 pt reports low level of pain controlled on oral medication. Pt reports improvement in leg pain.   Pt reports urination w/o difficulty.  Pt has been ambulating in hallway. Pt is cleared by PT for DC.   She was given perioperative antibiotics:  Anti-infectives (From admission, onward)   Start     Dose/Rate Route Frequency Ordered Stop   06/03/17 0000  ceFAZolin (ANCEF) IVPB 2g/100 mL premix     2 g 200 mL/hr over 30 Minutes Intravenous Every 8 hours 06/02/17 2009 06/03/17 1001   06/02/17 1329  ceFAZolin (ANCEF) IVPB 2g/100 mL premix     2 g 200 mL/hr over 30 Minutes Intravenous 30 min pre-op 06/02/17 1330 06/02/17 1606   06/02/17 1305  ceFAZolin (ANCEF) 2-4 GM/100ML-% IVPB    Note to Pharmacy:  [facility]   : cabinet override      06/02/17 1305 06/02/17 1551   06/02/17 1259  ceFAZolin (ANCEF) IVPB 2g/100 mL premix  Status:  Discontinued     2 g 200 mL/hr over 30 Minutes Intravenous 30 min pre-op 06/02/17 1259 06/02/17 1328    .  She was given sequential compression devices, early ambulation, and TED  for DVT prophylaxis.  She benefited maximally from the hospital stay and there were no complications.    Recent vital signs:  Vitals:   06/03/17 0403 06/03/17 0800  BP: 131/71 138/79  Pulse: 66 80  Resp: 18 18  Temp: 98 F (36.7 C) 97.9 F (36.6 C)  SpO2: 95% 97%    Recent laboratory studies:  Lab Results  Component Value Date   HGB 13.9 06/02/2017   HGB 14.0 06/12/2014   HGB 14.2 03/15/2014   Lab Results  Component Value Date   WBC 7.3 06/02/2017   PLT 258 06/02/2017   No results found for: INR Lab Results  Component Value Date   NA 140 06/02/2017   K 4.1 06/02/2017   CL 103 06/02/2017   CO2 27 06/02/2017   BUN 11 06/02/2017   CREATININE 0.60 06/02/2017   GLUCOSE 81 06/02/2017    Discharge Medications:   Allergies as of 06/03/2017      Reactions   Hydrocodone-acetaminophen Nausea And Vomiting      Medication List    TAKE these medications   gabapentin 300 MG capsule Commonly known as:  NEURONTIN Take 1 capsule (300 mg total) by mouth 3 (three) times daily. What changed:  how much to take   methocarbamol 500 MG tablet Commonly known as:  ROBAXIN Take 1 tablet (500 mg total) by mouth 3 (three) times daily.   ondansetron 4  MG disintegrating tablet Commonly known as:  ZOFRAN ODT Take 1 tablet (4 mg total) by mouth every 8 (eight) hours as needed.     ASK your doctor about these medications   oxyCODONE-acetaminophen 10-325 MG tablet Commonly known as:  PERCOCET Take 1 tablet by mouth every 6 (six) hours as needed for up to 5 days for pain. Ask about: Should I take this medication?       Diagnostic Studies: Dg Lumbar Spine 2-3 Views  Result Date: 06/02/2017 CLINICAL DATA:  Right L5-S1 discectomy EXAM: LUMBAR SPINE - 2-3 VIEW COMPARISON:  None. FINDINGS: Two lateral intraoperative localizing images of the lumbar spine were acquired. The first image acquired at 1609 hours demonstrates localizing needles projecting over the spinous processes of L3 and  L4. The subsequent second image at 1610 hours demonstrates localizing markers overlying the dorsum of the lumbar spine at the L5 pedicle and L5-S1 disc levels. IMPRESSION: Lumbar localization for L5-S1 discectomy as above. Electronically Signed   By: Ashley Royalty M.D.   On: 06/02/2017 19:06   Dg Spine Portable 1 View  Result Date: 06/02/2017 CLINICAL DATA:  Initial evaluation for spinal localization for lumbar surgery EXAM: PORTABLE SPINE - 1 VIEW COMPARISON:  Prior MRI from 10/16/2014 FINDINGS: Surgical trocar in place with tip overlying the spinous process of L5, just posterior to the L5-S1 interspace. Normal alignment. Vertebral body heights maintained. No acute or chronic fracture. IMPRESSION: Spinal instrument in place with tip overlying the L5 spinous process, just posterior to the L5-S1 interspace. Electronically Signed   By: Jeannine Boga M.D.   On: 06/02/2017 16:35    Disposition:  Pt will present to clinic in 2 weeks Post op medication provided  Discharge Instructions    Incentive spirometry RT   Complete by:  As directed       Follow-up Information    Melina Schools, MD Follow up in 2 week(s).   Specialty:  Orthopedic Surgery Contact information: [address] [place] [address] [phone]            Signed: Valinda Hoar 06/08/2017, 8:11 AM

## 2017-06-18 ENCOUNTER — Ambulatory Visit: Payer: Managed Care, Other (non HMO) | Admitting: Nurse Practitioner

## 2018-05-09 ENCOUNTER — Telehealth: Payer: Self-pay | Admitting: *Deleted

## 2018-05-09 NOTE — Telephone Encounter (Signed)
Medical records faxed to [facility]; [place] [phone]

## 2018-11-08 DIAGNOSIS — Z90710 Acquired absence of both cervix and uterus: Secondary | ICD-10-CM | POA: Insufficient documentation

## 2018-11-08 DIAGNOSIS — E039 Hypothyroidism, unspecified: Secondary | ICD-10-CM | POA: Insufficient documentation

## 2019-03-17 IMAGING — CR DG SPINE 1V PORT
1 series · 1 of 1 positions shown · non-contrast
Comparison: Prior MRI from 10/16/2014

CLINICAL DATA: Initial evaluation for spinal localization for
lumbar surgery

EXAM:
PORTABLE SPINE - 1 VIEW

[xtable lateral]
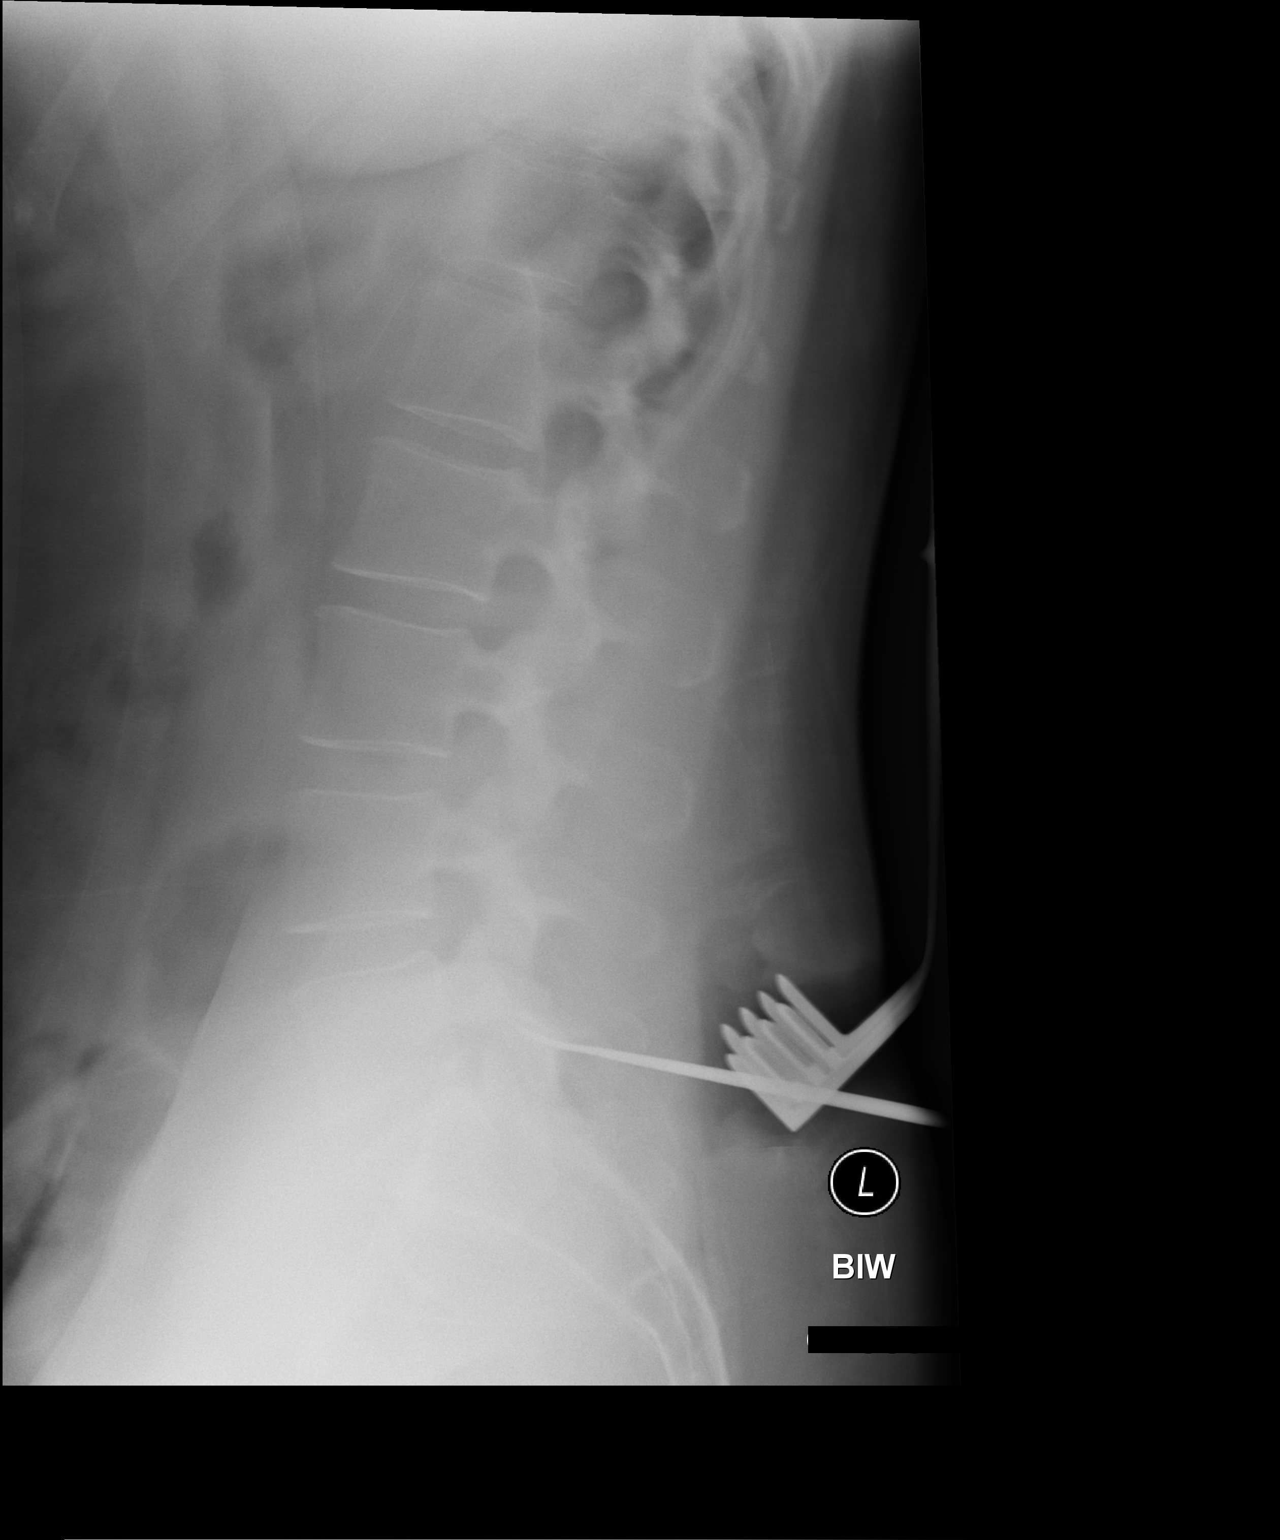

[1 of 1 positions shown; findings below may reference images not displayed]

FINDINGS: Surgical trocar in place with tip overlying the spinous process of
L5, just posterior to the L5-S1 interspace.

Normal alignment. Vertebral body heights maintained. No acute or
chronic fracture.
IMPRESSION: Spinal instrument in place with tip overlying the L5 spinous
process, just posterior to the L5-S1 interspace.

## 2020-03-22 DIAGNOSIS — F32A Depression, unspecified: Secondary | ICD-10-CM | POA: Insufficient documentation

## 2020-03-22 DIAGNOSIS — F419 Anxiety disorder, unspecified: Secondary | ICD-10-CM

## 2020-03-22 HISTORY — DX: Anxiety disorder, unspecified: F41.9

## 2021-03-19 DIAGNOSIS — Z6838 Body mass index (BMI) 38.0-38.9, adult: Secondary | ICD-10-CM | POA: Insufficient documentation

## 2021-07-28 DIAGNOSIS — E785 Hyperlipidemia, unspecified: Secondary | ICD-10-CM | POA: Insufficient documentation

## 2021-07-29 DIAGNOSIS — E6609 Other obesity due to excess calories: Secondary | ICD-10-CM | POA: Insufficient documentation

## 2022-05-22 ENCOUNTER — Other Ambulatory Visit (HOSPITAL_COMMUNITY): Payer: Self-pay

## 2022-05-22 MED ORDER — ZEPBOUND 2.5 MG/0.5ML ~~LOC~~ SOAJ
2.5000 mg | SUBCUTANEOUS | 2 refills | Status: DC
  Filled 2022-05-22 – 2022-05-28 (×3): qty 2, 28d supply, fill #0

## 2022-05-22 MED ORDER — MOUNJARO 2.5 MG/0.5ML ~~LOC~~ SOAJ
2.5000 mg | SUBCUTANEOUS | 2 refills | Status: DC
  Filled 2022-05-22 – 2022-05-28 (×3): qty 2, 28d supply, fill #0

## 2022-05-27 ENCOUNTER — Other Ambulatory Visit (HOSPITAL_COMMUNITY): Payer: Self-pay

## 2022-05-28 ENCOUNTER — Other Ambulatory Visit (HOSPITAL_COMMUNITY): Payer: Self-pay

## 2022-06-04 ENCOUNTER — Other Ambulatory Visit (HOSPITAL_COMMUNITY): Payer: Self-pay

## 2022-08-20 LAB — LAB REPORT - SCANNED
A1c: 5.8
EGFR: 116

## 2022-09-18 ENCOUNTER — Other Ambulatory Visit (HOSPITAL_COMMUNITY): Payer: Self-pay

## 2022-09-25 ENCOUNTER — Other Ambulatory Visit (HOSPITAL_COMMUNITY): Payer: Self-pay

## 2022-09-25 MED ORDER — PROGESTERONE MICRONIZED 100 MG PO CAPS
100.0000 mg | ORAL_CAPSULE | Freq: Every evening | ORAL | 1 refills | Status: AC
  Filled 2022-09-25 – 2022-10-08 (×2): qty 90, 90d supply, fill #0
  Filled 2022-12-17 – 2022-12-24 (×2): qty 90, 90d supply, fill #1

## 2022-10-07 ENCOUNTER — Ambulatory Visit (INDEPENDENT_AMBULATORY_CARE_PROVIDER_SITE_OTHER): Payer: BLUE CROSS/BLUE SHIELD | Admitting: Family Medicine

## 2022-10-07 ENCOUNTER — Other Ambulatory Visit (HOSPITAL_COMMUNITY): Payer: Self-pay

## 2022-10-07 ENCOUNTER — Encounter: Payer: Self-pay | Admitting: Family Medicine

## 2022-10-07 VITALS — BP 132/86 | HR 85 | Temp 97.3°F | Ht 66.5 in | Wt 215.6 lb

## 2022-10-07 DIAGNOSIS — R7303 Prediabetes: Secondary | ICD-10-CM

## 2022-10-07 DIAGNOSIS — Z789 Other specified health status: Secondary | ICD-10-CM | POA: Diagnosis not present

## 2022-10-07 DIAGNOSIS — F1090 Alcohol use, unspecified, uncomplicated: Secondary | ICD-10-CM

## 2022-10-07 DIAGNOSIS — Z853 Personal history of malignant neoplasm of breast: Secondary | ICD-10-CM

## 2022-10-07 DIAGNOSIS — I10 Essential (primary) hypertension: Secondary | ICD-10-CM

## 2022-10-07 DIAGNOSIS — Z6834 Body mass index (BMI) 34.0-34.9, adult: Secondary | ICD-10-CM

## 2022-10-07 DIAGNOSIS — R7989 Other specified abnormal findings of blood chemistry: Secondary | ICD-10-CM | POA: Insufficient documentation

## 2022-10-07 DIAGNOSIS — E669 Obesity, unspecified: Secondary | ICD-10-CM

## 2022-10-07 HISTORY — DX: Alcohol use, unspecified, uncomplicated: F10.90

## 2022-10-07 HISTORY — DX: Other specified health status: Z78.9

## 2022-10-07 LAB — HEPATIC FUNCTION PANEL
ALT: 141 U/L — ABNORMAL HIGH (ref 0–35)
AST: 109 U/L — ABNORMAL HIGH (ref 0–37)
Albumin: 4.5 g/dL (ref 3.5–5.2)
Alkaline Phosphatase: 64 U/L (ref 39–117)
Bilirubin, Direct: 0.2 mg/dL (ref 0.0–0.3)
Total Bilirubin: 0.8 mg/dL (ref 0.2–1.2)
Total Protein: 7.8 g/dL (ref 6.0–8.3)

## 2022-10-07 LAB — BASIC METABOLIC PANEL
BUN: 8 mg/dL (ref 6–23)
CO2: 26 mEq/L (ref 19–32)
Calcium: 9.8 mg/dL (ref 8.4–10.5)
Chloride: 97 mEq/L (ref 96–112)
Creatinine, Ser: 0.63 mg/dL (ref 0.40–1.20)
GFR: 101.99 mL/min (ref >60.00)
Glucose, Bld: 70 mg/dL (ref 70–99)
Potassium: 4.1 mEq/L (ref 3.5–5.1)
Sodium: 138 mEq/L (ref 135–145)

## 2022-10-07 LAB — CBC WITH DIFFERENTIAL/PLATELET
Basophils Absolute: 0 10*3/uL (ref 0.0–0.1)
Basophils Relative: 0.6 % (ref 0.0–3.0)
Eosinophils Absolute: 0.2 10*3/uL (ref 0.0–0.7)
Eosinophils Relative: 2 % (ref 0.0–5.0)
HCT: 45.3 % (ref 36.0–46.0)
Hemoglobin: 14.9 g/dL (ref 12.0–15.0)
Lymphocytes Relative: 17.3 % (ref 12.0–46.0)
Lymphs Abs: 1.3 10*3/uL (ref 0.7–4.0)
MCHC: 32.9 g/dL (ref 30.0–36.0)
MCV: 95 fl (ref 78.0–100.0)
Monocytes Absolute: 0.5 10*3/uL (ref 0.1–1.0)
Monocytes Relative: 6.2 % (ref 3.0–12.0)
Neutro Abs: 5.7 10*3/uL (ref 1.4–7.7)
Neutrophils Relative %: 73.9 % (ref 43.0–77.0)
Platelets: 287 10*3/uL (ref 150.0–400.0)
RBC: 4.76 Mil/uL (ref 3.87–5.11)
RDW: 14 % (ref 11.5–15.5)
WBC: 7.7 10*3/uL (ref 4.0–10.5)

## 2022-10-07 LAB — PROTIME-INR
INR: 1
Prothrombin Time: 11.2 s (ref 9.0–11.5)

## 2022-10-07 NOTE — Patient Instructions (Signed)
Thank you for trusting [place] with your healthcare.   Please go downstairs for labs.   Check your blood pressure at home 2-3 times per week.   Goal is <130/80   We will be in touch with results and with recommendations.

## 2022-10-07 NOTE — Progress Notes (Signed)
New Patient Office Visit  Subjective    Patient ID: Leslie Sullivan, female    DOB: [birth year]  Age: 52 y.o. MRN: [id number]  CC:  Chief Complaint  Patient presents with   Establish Care    HPI Leslie Sullivan presents to establish care Moved from [place] 1 year   HTN- she has been on losartan 25 mg, takes it regularly. Does not check BP at home.   Hypothyroidism- recent diagnosis by [facility]  Elevated TPOAb with normal TSH, free T4 and free T3 Started NP [facility] thyroid only 1-2 wks ago  Prediabetes - A1c 5.8% 08/20/2022 at [facility].  Started compounded GLP-1 with [facility]  Elevated LFTs  ALT 80.2 AST 56.2 Normal bilirubin and alk phos   States she drinks "a lot". 3 vodka cocktails nightly  States she has been drinking a lot of alcohol for several years.   Stress - good stress mainly.   Denies anxiety or depression   Colonoscopy in [place] last year.   Dr. Elnoria Howard is her husbands GI   Hives 2-3 mornings per week and itching.   Taking Claritin   Trouble falling asleep and staying asleep   Hysterectomy  Hx of ectopic pregnancy.   Hx of back surgery   Double mastectomy in 2013 for breast cancer. Chemo but no radiation.  Breast reconstruction  States she does not need follow up  Works in Audiological scientist estate   Outpatient Encounter Medications as of 10/07/2022  Medication Sig   B Complex Vitamins (VITAMIN B COMPLEX) TABS Take by mouth daily.   losartan (COZAAR) 25 MG tablet Take 1 tablet by mouth daily.   thyroid (NP THYROID) 30 MG tablet Take 30 mg by mouth daily before breakfast.   tirzepatide (ZEPBOUND) 2.5 MG/0.5ML Pen Inject 2.5 mg into the skin once a week. Every other week per patient   VITAMIN D PO Take by mouth.   [DISCONTINUED] gabapentin (NEURONTIN) 300 MG capsule Take 1 capsule (300 mg total) by mouth 3 (three) times daily. (Patient not taking: Reported on 10/07/2022)   [DISCONTINUED] methocarbamol (ROBAXIN) 500 MG tablet Take 1 tablet  (500 mg total) by mouth 3 (three) times daily. (Patient not taking: Reported on 10/07/2022)   [DISCONTINUED] ondansetron (ZOFRAN ODT) 4 MG disintegrating tablet Take 1 tablet (4 mg total) by mouth every 8 (eight) hours as needed. (Patient not taking: Reported on 10/07/2022)   [DISCONTINUED] progesterone (PROMETRIUM) 100 MG capsule Take 1 capsule (100 mg total) by mouth at bedtime. (Patient not taking: Reported on 10/07/2022)   [DISCONTINUED] tirzepatide [facility]) 2.5 MG/0.5ML Pen Inject 2.5 mg into the skin every 7 (seven) days. (Patient not taking: Reported on 10/07/2022)   [DISCONTINUED] tirzepatide (ZEPBOUND) 2.5 MG/0.5ML Pen Inject 2.5 mg into the skin once a week. (Patient not taking: Reported on 10/07/2022)   No facility-administered encounter medications on file as of 10/07/2022.    Past Medical History:  Diagnosis Date   Breast cancer ([facility])    Left, chemo 09/2010-03/2011, no radiation, bilateral mastectomy   Chicken pox    Ectopic pregnancy, tubal about 1996, 1998   x2   Hypertension     Past Surgical History:  Procedure Laterality Date   BLADDER SUSPENSION  03/2010   mesh, with hyst in 2012   BREAST ENHANCEMENT SURGERY Bilateral 05/2011   implants following mastectomy   ECTOPIC PREGNANCY SURGERY  1995, 1996   tubes removed in 1996   LAPAROSCOPIC ASSISTED VAGINAL HYSTERECTOMY  07/22/2010   pelvic organ prolapse,  cystocele, LOA with Transobrurator tape procedure - ovaries remain, tubes previously removed   LUMBAR MICRODISCECTOMY Right 06/02/2017   Procedure: RIGHT L5-S1 DISCECTOMY;  Surgeon: Venita Lick, MD;  Location: [facility] OR;  Service: Orthopedics;  Laterality: Right;   MASTECTOMY Bilateral 10/2010       TUBAL LIGATION      Family History  Problem Relation Age of Onset   Lung cancer Maternal Grandmother        smoker   Diabetes Paternal Grandmother    Heart attack Paternal Grandfather        before 66 years old   Alzheimer's disease Paternal Grandfather    Thyroid disease  Mother    Thyroid disease Sister     Social History   Socioeconomic History   Marital status: Married    Spouse name: Not on file   Number of children: 1   Years of education: 12   Highest education level: Not on file  Occupational History   Occupation: Airline pilot Rep  Tobacco Use   Smoking status: Never   Smokeless tobacco: Never  Substance and Sexual Activity   Alcohol use: Yes    Alcohol/week: 0.0 standard drinks of alcohol    Comment: socially   Drug use: No   Sexual activity: Yes    Partners: Male    Birth control/protection: Surgical    Comment: hysterectomy  Other Topics Concern   Not on file  Social History Narrative   Born and raised in [place]. Currently resides in a condo with her husband. 1 dog. Fun: Everything   Denies religious beliefs effecting health care.    Social Determinants of Health   Financial Resource Strain: Low Risk  (05/22/2022)   Received from [facility]   Overall Financial Resource Strain (CARDIA)    Difficulty of Paying Living Expenses: Not very hard  Food Insecurity: No Food Insecurity (05/22/2022)   Received from [facility]   Hunger Vital Sign    Worried About Running Out of Food in the Last Year: Never true    Ran Out of Food in the Last Year: Never true  Transportation Needs: No Transportation Needs (05/22/2022)   Received from [facility]   PRAPARE - Transportation    Lack of Transportation (Medical): No    Lack of Transportation (Non-Medical): No  Physical Activity: Not on file  Stress: Not on file  Social Connections: Not on file  Intimate Partner Violence: Not At Risk (05/22/2022)   [facility] from [facility]   Humiliation, Afraid, Rape, and Kick questionnaire    Fear of Current or Ex-Partner: No    Emotionally Abused: No    Physically Abused: No    Sexually Abused: No    Review of Systems  Constitutional:  Negative for chills and fever.  Eyes:  Negative for blurred vision and double vision.  Respiratory:   Negative for shortness of breath.   Cardiovascular:  Negative for chest pain, palpitations and leg swelling.  Gastrointestinal:  Negative for abdominal pain, constipation, diarrhea, nausea and vomiting.  Genitourinary:  Negative for dysuria, frequency and urgency.  Neurological:  Negative for dizziness and focal weakness.  Psychiatric/Behavioral:  Positive for substance abuse. Negative for depression. The patient has insomnia. The patient is not nervous/anxious.         Objective    BP 132/86 (BP Location: Left Arm, Patient Position: Sitting, Cuff Size: Normal)   Pulse 85   Temp (!) 97.3 F (36.3 C) (Temporal)   Ht  5' 6.5" (1.689 m)   Wt 215 lb 9.6 oz (97.8 kg)   LMP 04/10/2010 (Approximate)   SpO2 98%   BMI 34.28 kg/m   Physical Exam Constitutional:      General: She is not in acute distress.    Appearance: She is not ill-appearing.  HENT:     Mouth/Throat:     Mouth: Mucous membranes are moist.     Pharynx: Oropharynx is clear.  Eyes:     Extraocular Movements: Extraocular movements intact.     Conjunctiva/sclera: Conjunctivae normal.  Cardiovascular:     Rate and Rhythm: Normal rate and regular rhythm.  Pulmonary:     Effort: Pulmonary effort is normal.     Breath sounds: Normal breath sounds.  Abdominal:     General: Bowel sounds are normal. There is no distension.     Palpations: Abdomen is soft.     Tenderness: There is no abdominal tenderness. There is no right CVA tenderness, left CVA tenderness, guarding or rebound.  Musculoskeletal:     Cervical back: Normal range of motion and neck supple. No tenderness.     Right lower leg: No edema.     Left lower leg: No edema.  Lymphadenopathy:     Cervical: No cervical adenopathy.  Skin:    General: Skin is warm and dry.     Coloration: Skin is not jaundiced.  Neurological:     General: No focal deficit present.     Mental Status: She is alert and oriented to person, place, and time.     Cranial Nerves: No  cranial nerve deficit.     Motor: No weakness.     Coordination: Coordination normal.  Psychiatric:        Mood and Affect: Mood normal.        Behavior: Behavior normal.        Thought Content: Thought content normal.         Assessment & Plan:   Problem List Items Addressed This Visit       Cardiovascular and Mediastinum   Hypertension - Primary    Continue current medication. DASH diet. Monitor at home and follow up if readings are not controlled.       Relevant Medications   losartan (COZAAR) 25 MG tablet   Other Relevant Sullivan   CBC with Differential/Platelet (Completed)   Basic metabolic panel (Completed)     Other   Alcohol use    Encouraged her to reduce alcohol intake.       Elevated LFTs    Recheck hepatic function. Reduce alcohol intake. Consider referral to GI, she would like to see Dr. Elnoria Howard if needed.       Relevant Sullivan   CBC with Differential/Platelet (Completed)   Basic metabolic panel (Completed)   Hepatic function panel (Completed)   Protime-INR   Acute Hep Panel & Hep B Surface Ab   Protime-INR   Protime-INR (Completed)   History of breast cancer   Obesity (BMI 30.0-34.9)    Currently taking compound GLP-1 prescribed by Integrative therapy      Prediabetes    Last A1c 5.8%. labs done at integrative therapy office       Return for pending labs.   Hetty Blend, NP-C

## 2022-10-07 NOTE — Assessment & Plan Note (Signed)
Last A1c 5.8%. labs done at integrative therapy office

## 2022-10-07 NOTE — Assessment & Plan Note (Signed)
Currently taking compound GLP-1 prescribed by Integrative therapy

## 2022-10-07 NOTE — Assessment & Plan Note (Signed)
Encouraged her to reduce alcohol intake.

## 2022-10-07 NOTE — Assessment & Plan Note (Signed)
Recheck hepatic function. Reduce alcohol intake. Consider referral to GI, she would like to see Dr. Elnoria Howard if needed.

## 2022-10-07 NOTE — Assessment & Plan Note (Signed)
Continue current medication. DASH diet. Monitor at home and follow up if readings are not controlled.

## 2022-10-08 ENCOUNTER — Other Ambulatory Visit (HOSPITAL_COMMUNITY): Payer: Self-pay

## 2022-10-08 ENCOUNTER — Other Ambulatory Visit: Payer: Self-pay | Admitting: Family Medicine

## 2022-10-08 DIAGNOSIS — R7989 Other specified abnormal findings of blood chemistry: Secondary | ICD-10-CM

## 2022-10-08 LAB — ACUTE HEP PANEL AND HEP B SURFACE AB
HEPATITIS C ANTIBODY REFILL$(REFL): NONREACTIVE
Hep A IgM: NONREACTIVE
Hep B C IgM: NONREACTIVE
Hepatitis B Surface Ag: NONREACTIVE

## 2022-10-08 LAB — REFLEX TIQ

## 2022-10-08 NOTE — Progress Notes (Signed)
Please call her and let know that her liver enzymes are elevated, fairly significantly. Negative acute hepatitis panel. I recommend getting a liver US and a referral to GI as we discussed. I also recommend cutting back and stopping alcohol. Does she want to see [facility] or Dr. Elnoria Howard? Is she ok with [place] order?

## 2022-10-20 ENCOUNTER — Encounter: Payer: Self-pay | Admitting: Family Medicine

## 2022-11-10 ENCOUNTER — Ambulatory Visit: Payer: Commercial Managed Care - PPO | Admitting: Family Medicine

## 2022-11-12 ENCOUNTER — Ambulatory Visit: Payer: BLUE CROSS/BLUE SHIELD | Admitting: Family Medicine

## 2022-11-14 ENCOUNTER — Encounter (HOSPITAL_COMMUNITY): Payer: Self-pay

## 2022-11-14 ENCOUNTER — Other Ambulatory Visit: Payer: Self-pay

## 2022-11-14 ENCOUNTER — Observation Stay (HOSPITAL_COMMUNITY)
Admission: EM | Admit: 2022-11-14 | Discharge: 2022-11-15 | Disposition: A | Discharge status: Home / Self Care | Payer: BC Managed Care – PPO | Attending: Internal Medicine | Admitting: Internal Medicine

## 2022-11-14 ENCOUNTER — Emergency Department (HOSPITAL_COMMUNITY): Payer: BC Managed Care – PPO

## 2022-11-14 DIAGNOSIS — Z79899 Other long term (current) drug therapy: Secondary | ICD-10-CM | POA: Insufficient documentation

## 2022-11-14 DIAGNOSIS — E872 Acidosis, unspecified: Secondary | ICD-10-CM | POA: Diagnosis present

## 2022-11-14 DIAGNOSIS — Z789 Other specified health status: Secondary | ICD-10-CM | POA: Diagnosis present

## 2022-11-14 DIAGNOSIS — E039 Hypothyroidism, unspecified: Secondary | ICD-10-CM | POA: Diagnosis not present

## 2022-11-14 DIAGNOSIS — I10 Essential (primary) hypertension: Secondary | ICD-10-CM | POA: Diagnosis present

## 2022-11-14 DIAGNOSIS — R7989 Other specified abnormal findings of blood chemistry: Secondary | ICD-10-CM | POA: Diagnosis present

## 2022-11-14 DIAGNOSIS — F1090 Alcohol use, unspecified, uncomplicated: Secondary | ICD-10-CM | POA: Diagnosis present

## 2022-11-14 DIAGNOSIS — E873 Alkalosis: Secondary | ICD-10-CM | POA: Diagnosis present

## 2022-11-14 DIAGNOSIS — Z853 Personal history of malignant neoplasm of breast: Secondary | ICD-10-CM | POA: Insufficient documentation

## 2022-11-14 DIAGNOSIS — K760 Fatty (change of) liver, not elsewhere classified: Secondary | ICD-10-CM | POA: Diagnosis not present

## 2022-11-14 DIAGNOSIS — E8889 Other specified metabolic disorders: Secondary | ICD-10-CM | POA: Diagnosis not present

## 2022-11-14 DIAGNOSIS — E1110 Type 2 diabetes mellitus with ketoacidosis without coma: Secondary | ICD-10-CM | POA: Diagnosis not present

## 2022-11-14 DIAGNOSIS — E871 Hypo-osmolality and hyponatremia: Secondary | ICD-10-CM | POA: Diagnosis not present

## 2022-11-14 DIAGNOSIS — R112 Nausea with vomiting, unspecified: Secondary | ICD-10-CM | POA: Diagnosis present

## 2022-11-14 DIAGNOSIS — E66812 Obesity, class 2: Secondary | ICD-10-CM | POA: Diagnosis present

## 2022-11-14 DIAGNOSIS — E785 Hyperlipidemia, unspecified: Secondary | ICD-10-CM | POA: Diagnosis present

## 2022-11-14 DIAGNOSIS — R7303 Prediabetes: Secondary | ICD-10-CM | POA: Diagnosis present

## 2022-11-14 DIAGNOSIS — E1310 Other specified diabetes mellitus with ketoacidosis without coma: Principal | ICD-10-CM

## 2022-11-14 LAB — URINALYSIS, ROUTINE W REFLEX MICROSCOPIC
Bilirubin Urine: NEGATIVE
Glucose, UA: NEGATIVE mg/dL
Hgb urine dipstick: NEGATIVE
Ketones, ur: 80 mg/dL — AB
Leukocytes,Ua: NEGATIVE
Nitrite: NEGATIVE
Protein, ur: 30 mg/dL — AB
Specific Gravity, Urine: 1.025 (ref 1.005–1.030)
pH: 5 (ref 5.0–8.0)

## 2022-11-14 LAB — BASIC METABOLIC PANEL
Anion gap: 13 (ref 5–15)
Anion gap: 12 (ref 5–15)
Anion gap: 10 (ref 5–15)
BUN: 9 mg/dL (ref 6–20)
BUN: 9 mg/dL (ref 6–20)
BUN: 7 mg/dL (ref 6–20)
CO2: 13 mmol/L — ABNORMAL LOW (ref 22–32)
CO2: 16 mmol/L — ABNORMAL LOW (ref 22–32)
CO2: 17 mmol/L — ABNORMAL LOW (ref 22–32)
Calcium: 7.8 mg/dL — ABNORMAL LOW (ref 8.9–10.3)
Calcium: 8.1 mg/dL — ABNORMAL LOW (ref 8.9–10.3)
Calcium: 8.4 mg/dL — ABNORMAL LOW (ref 8.9–10.3)
Chloride: 106 mmol/L (ref 98–111)
Chloride: 108 mmol/L (ref 98–111)
Chloride: 106 mmol/L (ref 98–111)
Creatinine, Ser: 0.88 mg/dL (ref 0.44–1.00)
Creatinine, Ser: 0.67 mg/dL (ref 0.44–1.00)
Creatinine, Ser: 0.72 mg/dL (ref 0.44–1.00)
GFR, Estimated: >60 mL/min (ref >60)
GFR, Estimated: >60 mL/min (ref >60)
GFR, Estimated: >60 mL/min (ref >60)
Glucose, Bld: 341 mg/dL — ABNORMAL HIGH (ref 70–99)
Glucose, Bld: 181 mg/dL — ABNORMAL HIGH (ref 70–99)
Glucose, Bld: 117 mg/dL — ABNORMAL HIGH (ref 70–99)
Potassium: 3.9 mmol/L (ref 3.5–5.1)
Potassium: 3.7 mmol/L (ref 3.5–5.1)
Potassium: 3.8 mmol/L (ref 3.5–5.1)
Sodium: 132 mmol/L — ABNORMAL LOW (ref 135–145)
Sodium: 136 mmol/L (ref 135–145)
Sodium: 133 mmol/L — ABNORMAL LOW (ref 135–145)

## 2022-11-14 LAB — COMPREHENSIVE METABOLIC PANEL
ALT: 124 U/L — ABNORMAL HIGH (ref 0–44)
ALT: 99 U/L — ABNORMAL HIGH (ref 0–44)
AST: 70 U/L — ABNORMAL HIGH (ref 15–41)
AST: 57 U/L — ABNORMAL HIGH (ref 15–41)
Albumin: 5.2 g/dL — ABNORMAL HIGH (ref 3.5–5.0)
Albumin: 4.2 g/dL (ref 3.5–5.0)
Alkaline Phosphatase: 68 U/L (ref 38–126)
Alkaline Phosphatase: 56 U/L (ref 38–126)
Anion gap: 28 — ABNORMAL HIGH (ref 5–15)
Anion gap: 21 — ABNORMAL HIGH (ref 5–15)
BUN: 9 mg/dL (ref 6–20)
BUN: 10 mg/dL (ref 6–20)
CO2: 10 mmol/L — ABNORMAL LOW (ref 22–32)
CO2: 10 mmol/L — ABNORMAL LOW (ref 22–32)
Calcium: 9.7 mg/dL (ref 8.9–10.3)
Calcium: 8.4 mg/dL — ABNORMAL LOW (ref 8.9–10.3)
Chloride: 95 mmol/L — ABNORMAL LOW (ref 98–111)
Chloride: 97 mmol/L — ABNORMAL LOW (ref 98–111)
Creatinine, Ser: 0.98 mg/dL (ref 0.44–1.00)
Creatinine, Ser: 0.94 mg/dL (ref 0.44–1.00)
GFR, Estimated: >60 mL/min (ref >60)
GFR, Estimated: >60 mL/min (ref >60)
Glucose, Bld: 106 mg/dL — ABNORMAL HIGH (ref 70–99)
Glucose, Bld: 89 mg/dL (ref 70–99)
Potassium: 4.3 mmol/L (ref 3.5–5.1)
Potassium: 4.3 mmol/L (ref 3.5–5.1)
Sodium: 133 mmol/L — ABNORMAL LOW (ref 135–145)
Sodium: 128 mmol/L — ABNORMAL LOW (ref 135–145)
Total Bilirubin: 2.1 mg/dL — ABNORMAL HIGH (ref 0.3–1.2)
Total Bilirubin: 2.3 mg/dL — ABNORMAL HIGH (ref 0.3–1.2)
Total Protein: 9.2 g/dL — ABNORMAL HIGH (ref 6.5–8.1)
Total Protein: 7.8 g/dL (ref 6.5–8.1)

## 2022-11-14 LAB — BLOOD GAS, VENOUS
Acid-base deficit: 15.3 mmol/L — ABNORMAL HIGH (ref 0.0–2.0)
Bicarbonate: 11.3 mmol/L — ABNORMAL LOW (ref 20.0–28.0)
O2 Saturation: 56.6 %
Patient temperature: 37
pCO2, Ven: 29 mm[Hg] — ABNORMAL LOW (ref 44–60)
pH, Ven: 7.2 — ABNORMAL LOW (ref 7.25–7.43)
pO2, Ven: <31 mm[Hg] — CL (ref 32–45)

## 2022-11-14 LAB — BETA-HYDROXYBUTYRIC ACID
Beta-Hydroxybutyric Acid: >8 mmol/L — ABNORMAL HIGH (ref 0.05–0.27)
Beta-Hydroxybutyric Acid: 3.13 mmol/L — ABNORMAL HIGH (ref 0.05–0.27)

## 2022-11-14 LAB — TSH: TSH: 1.027 u[IU]/mL (ref 0.350–4.500)

## 2022-11-14 LAB — SALICYLATE LEVEL: Salicylate Lvl: <7 mg/dL — ABNORMAL LOW (ref 7.0–30.0)

## 2022-11-14 LAB — HEMOGLOBIN A1C
Hgb A1c MFr Bld: 5.4 % (ref 4.8–5.6)
Mean Plasma Glucose: 108.28 mg/dL

## 2022-11-14 LAB — CBC
HCT: 49.5 % — ABNORMAL HIGH (ref 36.0–46.0)
Hemoglobin: 15.9 g/dL — ABNORMAL HIGH (ref 12.0–15.0)
MCH: 31.7 pg (ref 26.0–34.0)
MCHC: 32.1 g/dL (ref 30.0–36.0)
MCV: 98.6 fL (ref 80.0–100.0)
Platelets: 194 10*3/uL (ref 150–400)
RBC: 5.02 MIL/uL (ref 3.87–5.11)
RDW: 13.1 % (ref 11.5–15.5)
WBC: 9.1 10*3/uL (ref 4.0–10.5)
nRBC: 0 % (ref 0.0–0.2)

## 2022-11-14 LAB — I-STAT CG4 LACTIC ACID, ED: Lactic Acid, Venous: 1 mmol/L (ref 0.5–1.9)

## 2022-11-14 LAB — ETHANOL: Alcohol, Ethyl (B): <10 mg/dL (ref <10)

## 2022-11-14 LAB — LIPASE, BLOOD: Lipase: 54 U/L — ABNORMAL HIGH (ref 11–51)

## 2022-11-14 LAB — CBG MONITORING, ED: Glucose-Capillary: 113 mg/dL — ABNORMAL HIGH (ref 70–99)

## 2022-11-14 LAB — GLUCOSE, CAPILLARY
Glucose-Capillary: 234 mg/dL — ABNORMAL HIGH (ref 70–99)
Glucose-Capillary: 124 mg/dL — ABNORMAL HIGH (ref 70–99)

## 2022-11-14 LAB — MAGNESIUM: Magnesium: 2.2 mg/dL (ref 1.7–2.4)

## 2022-11-14 MED ORDER — SODIUM CHLORIDE 0.9 % IV BOLUS
1000.0000 mL | Freq: Once | INTRAVENOUS | Status: AC
  Administered 2022-11-14: 1000 mL via INTRAVENOUS

## 2022-11-14 MED ORDER — PROCHLORPERAZINE EDISYLATE 10 MG/2ML IJ SOLN: 10.0000 mg | Freq: Four times a day (QID) | INTRAMUSCULAR | Status: DC | PRN

## 2022-11-14 MED ORDER — ONDANSETRON HCL 4 MG PO TABS
4.0000 mg | ORAL_TABLET | Freq: Four times a day (QID) | ORAL | Status: DC | PRN
  Administered 2022-11-14: 4 mg via ORAL
  Filled 2022-11-14: qty 1

## 2022-11-14 MED ORDER — INSULIN ASPART 100 UNIT/ML IJ SOLN
0.0000 [IU] | Freq: Three times a day (TID) | INTRAMUSCULAR | Status: DC
  Administered 2022-11-14: 3 [IU] via SUBCUTANEOUS

## 2022-11-14 MED ORDER — ONDANSETRON HCL 4 MG/2ML IJ SOLN
4.0000 mg | Freq: Once | INTRAMUSCULAR | Status: AC
  Administered 2022-11-14: 4 mg via INTRAVENOUS
  Filled 2022-11-14: qty 2

## 2022-11-14 MED ORDER — INSULIN ASPART 100 UNIT/ML IJ SOLN: 12.0000 [IU] | Freq: Once | INTRAMUSCULAR | Status: DC

## 2022-11-14 MED ORDER — IOHEXOL 300 MG/ML  SOLN
100.0000 mL | Freq: Once | INTRAMUSCULAR | Status: AC | PRN
  Administered 2022-11-14: 100 mL via INTRAVENOUS

## 2022-11-14 MED ORDER — DEXTROSE 50 % IV SOLN
25.0000 g | Freq: Once | INTRAVENOUS | Status: AC
  Administered 2022-11-14: 25 g via INTRAVENOUS
  Filled 2022-11-14: qty 50

## 2022-11-14 MED ORDER — THYROID 30 MG PO TABS
30.0000 mg | ORAL_TABLET | Freq: Every day | ORAL | Status: DC
  Filled 2022-11-14: qty 1

## 2022-11-14 MED ORDER — POTASSIUM CHLORIDE CRYS ER 20 MEQ PO TBCR
40.0000 meq | EXTENDED_RELEASE_TABLET | Freq: Once | ORAL | Status: AC
  Administered 2022-11-14: 40 meq via ORAL
  Filled 2022-11-14: qty 2

## 2022-11-14 MED ORDER — LABETALOL HCL 5 MG/ML IV SOLN: 20.0000 mg | INTRAVENOUS | Status: DC | PRN

## 2022-11-14 MED ORDER — KCL IN DEXTROSE-NACL 20-5-0.9 MEQ/L-%-% IV SOLN
INTRAVENOUS | Status: DC
  Filled 2022-11-14: qty 1000

## 2022-11-14 MED ORDER — LOSARTAN POTASSIUM 25 MG PO TABS
25.0000 mg | ORAL_TABLET | Freq: Every day | ORAL | Status: DC
  Administered 2022-11-15: 25 mg via ORAL
  Filled 2022-11-14: qty 1

## 2022-11-14 MED ORDER — PROGESTERONE MICRONIZED 100 MG PO CAPS
100.0000 mg | ORAL_CAPSULE | Freq: Every day | ORAL | Status: DC
  Administered 2022-11-14: 100 mg via ORAL
  Filled 2022-11-14: qty 1

## 2022-11-14 MED ORDER — ORAL CARE MOUTH RINSE: 15.0000 mL | OROMUCOSAL | Status: DC | PRN

## 2022-11-14 MED ORDER — ONDANSETRON HCL 4 MG/2ML IJ SOLN
4.0000 mg | Freq: Four times a day (QID) | INTRAMUSCULAR | Status: DC | PRN
  Administered 2022-11-14: 4 mg via INTRAVENOUS
  Filled 2022-11-14: qty 2

## 2022-11-14 MED ORDER — DEXTROSE-SODIUM CHLORIDE 5-0.9 % IV SOLN: INTRAVENOUS | Status: DC

## 2022-11-14 MED ORDER — LABETALOL HCL 5 MG/ML IV SOLN
20.0000 mg | Freq: Once | INTRAVENOUS | Status: AC
  Administered 2022-11-14: 20 mg via INTRAVENOUS
  Filled 2022-11-14: qty 4

## 2022-11-14 MED ORDER — PANTOPRAZOLE SODIUM 40 MG IV SOLR
40.0000 mg | Freq: Two times a day (BID) | INTRAVENOUS | Status: DC
  Administered 2022-11-14 – 2022-11-15 (×3): 40 mg via INTRAVENOUS
  Filled 2022-11-14 (×3): qty 10

## 2022-11-14 NOTE — ED Notes (Signed)
To CT via stretcher

## 2022-11-14 NOTE — Plan of Care (Signed)

## 2022-11-14 NOTE — ED Notes (Signed)
Alert, NAD, calm, interactive, resps e/u, speaking clearly, denies pain, nausea, sob or dizziness.

## 2022-11-14 NOTE — ED Notes (Signed)
EDP at [facility] 

## 2022-11-14 NOTE — H&P (Signed)
History and Physical    Patient: Leslie Sullivan QIO:[id number] DOB: 1970/06/24 DOA: 11/14/2022 DOS: the patient was seen and examined on 11/14/2022 PCP: Avanell Shackleton, NP-C  Patient coming from: Home  Chief Complaint:  Chief Complaint  Patient presents with   Nausea   Emesis   HPI: Leslie Sullivan is a 52 y.o. female with medical history significant of alcohol use, abnormal LFTs, anxiety, depression in remission, breast cancer, varicella-zoster, tubal ectopic pregnancy, hypertension, class II obesity, hyperlipidemia, prediabetes, right lumbar radiculopathy who presented to the emergency department complaints of nausea, emesis for about a week since she restarted Mounjaro again.  She used the lowest dose that was 2.5 mg.  She had been using days earlier in the year but had several months without using it.  Her emesis content was mainly yellowish fluid, but stated that this morning she had brown looking emesis.  No history of PUD, GERD or gastritis.  She denied fever, chills, rhinorrhea, sore throat, wheezing or hemoptysis.  She has noticed some hyperventilation and mild dyspnea at times.  She also has had some palpitations, but no chest pain, diaphoresis, PND, orthopnea or pitting edema of the lower extremities.  No abdominal pain, diarrhea, constipation, melena or hematochezia.  No flank pain, dysuria, frequency or hematuria.  No polyuria, polydipsia, polyphagia or blurred vision.   Lab work: CBC showed a white count of 9.1, hemoglobin 15.9 g/dL and platelets 324.  Lipase was 54 units/L.  Unremarkable lactic acid salicylate and acetaminophen level.  CMP showed a sodium of 133, potassium 4.3, chloride 95 and CO2 10 mmol/L with an anion gap of 28.  Glucose 106, BUN 9, creatinine 0.98 and calcium 9.7 mg/dL.  Total protein 9.2 and albumin 5.2 g/dL.  AST 70 and ALT 124 units/L.  Total bilirubin was 2.1 mg/dL.  Alkaline phosphatase was normal.  Imaging: CT abdomen/pelvis with contrast with no bowel obstruction,  free air or free fluid.  Appendix was normal.  There was diffuse fatty liver infiltration.  Left adrenal gland had a focal area measuring 10 mm maximally.  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  No specific follow-up recommended by radiology.  ED course: Initial vital signs were temperature 98.1 F, pulse 121, respiration 19, BP 142/92 mmHg O2 sat 99% on room air.  The patient received ondansetron 4 mg IVP and 1000 mL of normal saline bolus.   Review of Systems: As mentioned in the history of present illness. All other systems reviewed and are negative. Past Medical History:  Diagnosis Date   Breast cancer (HCC)    Left, chemo 09/2010-03/2011, no radiation, bilateral mastectomy   Chicken pox    Ectopic pregnancy, tubal about 1996, 1998   x2   Hypertension    Past Surgical History:  Procedure Laterality Date   BLADDER SUSPENSION  03/2010   mesh, with hyst in 2012   BREAST ENHANCEMENT SURGERY Bilateral 05/2011   implants following mastectomy   ECTOPIC PREGNANCY SURGERY  1995, 1996   tubes removed in 1996   LAPAROSCOPIC ASSISTED VAGINAL HYSTERECTOMY  07/22/2010   pelvic organ prolapse, cystocele, LOA with Transobrurator tape procedure - ovaries remain, tubes previously removed   LUMBAR MICRODISCECTOMY Right 06/02/2017   Procedure: RIGHT L5-S1 DISCECTOMY;  Surgeon: Venita Lick, MD;  Location: [facility] OR;  Service: Orthopedics;  Laterality: Right;   MASTECTOMY Bilateral 10/2010       TUBAL LIGATION     Social History:  reports that she has never smoked. She has never used smokeless  tobacco. She reports current alcohol use. She reports that she does not use drugs.  Allergies  Allergen Reactions   Hydrocodone-Acetaminophen Nausea And Vomiting    Family History  Problem Relation Age of Onset   Lung cancer Maternal Grandmother        smoker   Diabetes Paternal Grandmother    Heart attack Paternal Grandfather        before 3 years old   Alzheimer's disease Paternal Grandfather     Thyroid disease Mother    Thyroid disease Sister     Prior to Admission medications   Medication Sig Start Date End Date Taking? Authorizing Provider  B Complex Vitamins (VITAMIN B COMPLEX) TABS Take by mouth daily.    [provider]  losartan (COZAAR) 25 MG tablet Take 1 tablet by mouth daily. 05/01/21   [provider]  progesterone (PROMETRIUM) 100 MG capsule Take 1 capsule (100 mg total) by mouth at bedtime. Patient not taking: Reported on 10/07/2022 09/25/22     thyroid (NP THYROID) 30 MG tablet Take 30 mg by mouth daily before breakfast.    [provider]  tirzepatide (ZEPBOUND) 2.5 MG/0.5ML Pen Inject 2.5 mg into the skin once a week. Every other week per patient 05/22/22   [provider]  VITAMIN D PO Take by mouth.    [provider]    Physical Exam: Vitals:   11/14/22 0630 11/14/22 0700 11/14/22 0800 11/14/22 0900  BP: 112/77 122/80 126/88 (!) 153/101  Pulse: (!) 121 (!) 117 (!) 116 96  Resp:  (!) 28 (!) 26 (!) 21  Temp:      SpO2: 99% 98% 95% 100%   Physical Exam Vitals reviewed.  Constitutional:      General: She is awake. She is not in acute distress.    Appearance: She is obese. She is ill-appearing.  HENT:     Head: Normocephalic.     Nose: No rhinorrhea.     Mouth/Throat:     Mouth: Mucous membranes are dry.  Eyes:     General: No scleral icterus.    Pupils: Pupils are equal, round, and reactive to light.  Neck:     Vascular: No JVD.  Cardiovascular:     Rate and Rhythm: Regular rhythm. Tachycardia present.     Heart sounds: S1 normal and S2 normal.  Pulmonary:     Effort: Pulmonary effort is normal.     Breath sounds: Normal breath sounds.  Abdominal:     General: Bowel sounds are normal.     Palpations: Abdomen is soft.     Tenderness: There is abdominal tenderness.  Musculoskeletal:     Cervical back: Neck supple.     Right lower leg: No edema.     Left lower leg: No edema.  Skin:    General: Skin is  warm and dry.  Neurological:     General: No focal deficit present.     Mental Status: She is alert and oriented to person, place, and time.  Psychiatric:        Mood and Affect: Mood normal.        Behavior: Behavior normal. Behavior is cooperative.   Data Reviewed:  Results are pending, will review when available.  Assessment and Plan: Principal Problem:   Metabolic acidosis with respiratory alkalosis In the setting of:   Ketosis (HCC) Due to persistent N/V Involuntary fasting post Mounjaro injection. Patient is clinically stable. She feels much better after antiemetic and fluids. Observation/PCU.  Advance diet as tolerated. Continue IV fluids. Dextrose 25 g IVP. Hydration with D5NS BMP every 4 hours. BHA every 8 hours. Replace electrolytes as needed.  Active Problems:   Hyponatremia Secondary to GI losses. Continue normal saline infusion. Follow-up sodium level.    Hepatic steatosis   Elevated LFTs Alcohol cessation. Continue weight loss measures.    Alcohol use Drinks 3-4 cocktails nightly. Last drink about a week ago. No signs of EtOH withdrawal. Very motivated to completely cease. -Abnormal LFTs. -Hypertension improvement. -Will help with weight loss.    Hypertension Resume losartan 25 mg po daily. As needed IV labetalol. Monitor BP and HR.     Acquired hypothyroidism Check TSH level.    Dyslipidemia Currently on that medication. Lifestyle modifications for the moment. If needed, consider pravastatin once LFTs improved.    Prediabetes Weight loss and other lifestyle modifications.    Class 2 obesity Current BMI still pending. Lifestyle modifications. Follow-up with closely PCP.    Advance Care Planning:   Code Status: Full Code   Consults:   Family Communication:   Severity of Illness: The appropriate patient status for this patient is OBSERVATION. Observation status is judged to be reasonable and necessary in order to provide the  required intensity of service to ensure the patient's safety. The patient's presenting symptoms, physical exam findings, and initial radiographic and laboratory data in the context of their medical condition is felt to place them at decreased risk for further clinical deterioration. Furthermore, it is anticipated that the patient will be medically stable for discharge from the hospital within 2 midnights of admission.   Author: Bobette Mo, MD 11/14/2022 10:02 AM  For on call review [email or web address].

## 2022-11-14 NOTE — ED Notes (Signed)
7:03 AM  Patient alert and oriented x 4. She has equal rise and fall of the chest wall with clear lung sounds. She endorses having nausea and vomiting x 1 week progressively worsening. She states that she vomits 3-4 per day, but has had increased vomiting to 1 time per hour since 1700 last evening. She denies diarrhea, abdominal pain, urinary sx, fevers, chills. She does endorse feeling like her "heart is racing." She reports recently taking Munjaro. Hx HTN. IV started and labs drawn and sent. Pending urinalysis and lab results. Call light in reach. Bed in lowest position. Pending ER provider assessment and orders.

## 2022-11-14 NOTE — ED Triage Notes (Signed)
Patient in today reporting n/v for over 1 week, states that she has not been able to keep anything down.

## 2022-11-14 NOTE — ED Notes (Signed)
Admitting at BS

## 2022-11-14 NOTE — ED Provider Notes (Addendum)
[place] EMERGENCY DEPARTMENT AT [facility] Provider Note   CSN: [id number] Arrival date & time: 11/14/22  [id number]     History  Chief Complaint  Patient presents with   Nausea   Emesis    Leslie Sullivan is a 52 y.o. female.  Patient here with nausea and vomiting.  History of hypertension.  She has been on Mounjaro.  Started to get sick after her last dose of that.  Now overnight with multiple episodes of emesis.  Some generalized abdominal discomfort.  Started having some palpitations.  She denies any diarrhea.  She has had abdominal surgery in the past.  She has not felt good since her last Mounjaro shot and wonders if it is a side effect of this.  She denies any fever chills shortness of breath.  No pain with urination.  The history is provided by the patient.       Home Medications Prior to Admission medications   Medication Sig Start Date End Date Taking? Authorizing Provider  B Complex Vitamins (VITAMIN B COMPLEX) TABS Take by mouth daily.    [provider]  losartan (COZAAR) 25 MG tablet Take 1 tablet by mouth daily. 05/01/21   [provider]  progesterone (PROMETRIUM) 100 MG capsule Take 1 capsule (100 mg total) by mouth at bedtime. Patient not taking: Reported on 10/07/2022 09/25/22     thyroid (NP THYROID) 30 MG tablet Take 30 mg by mouth daily before breakfast.    [provider]  tirzepatide (ZEPBOUND) 2.5 MG/0.5ML Pen Inject 2.5 mg into the skin once a week. Every other week per patient 05/22/22   [provider]  VITAMIN D PO Take by mouth.    [provider]      Allergies    Hydrocodone-acetaminophen    Review of Systems   Review of Systems  Physical Exam Updated Vital Signs BP (!) 153/101   Pulse 96   Temp 98.1 F (36.7 C)   Resp (!) 21   LMP 04/10/2010 (Approximate)   SpO2 100%  Physical Exam Vitals and nursing note reviewed.  Constitutional:      General: She is not in acute distress.     Appearance: She is well-developed. She is not ill-appearing.  HENT:     Head: Normocephalic and atraumatic.     Nose: Nose normal.     Mouth/Throat:     Mouth: Mucous membranes are moist.  Eyes:     Conjunctiva/sclera: Conjunctivae normal.     Pupils: Pupils are equal, round, and reactive to light.  Cardiovascular:     Rate and Rhythm: Normal rate and regular rhythm.     Pulses: Normal pulses.     Heart sounds: Normal heart sounds. No murmur heard. Pulmonary:     Effort: Pulmonary effort is normal. No respiratory distress.     Breath sounds: Normal breath sounds.  Abdominal:     General: There is distension.     Palpations: Abdomen is soft.     Tenderness: There is abdominal tenderness.  Musculoskeletal:        General: No swelling.     Cervical back: Normal range of motion and neck supple.  Skin:    General: Skin is warm and dry.     Capillary Refill: Capillary refill takes less than 2 seconds.  Neurological:     General: No focal deficit present.     Mental Status: She is alert.  Psychiatric:        Mood  and Affect: Mood normal.     ED Results / Procedures / Treatments   Labs (all labs ordered are listed, but only abnormal results are displayed) Labs Reviewed  LIPASE, BLOOD - Abnormal; Notable for the following components:      Result Value   Lipase 54 (*)    All other components within normal limits  COMPREHENSIVE METABOLIC PANEL - Abnormal; Notable for the following components:   Sodium 133 (*)    Chloride 95 (*)    CO2 10 (*)    Glucose, Bld 106 (*)    Total Protein 9.2 (*)    Albumin 5.2 (*)    AST 70 (*)    ALT 124 (*)    Total Bilirubin 2.1 (*)    Anion gap 28 (*)    All other components within normal limits  CBC - Abnormal; Notable for the following components:   Hemoglobin 15.9 (*)    HCT 49.5 (*)    All other components within normal limits  COMPREHENSIVE METABOLIC PANEL - Abnormal; Notable for the following components:   Sodium 128 (*)     Chloride 97 (*)    CO2 10 (*)    Calcium 8.4 (*)    AST 57 (*)    ALT 99 (*)    Total Bilirubin 2.3 (*)    Anion gap 21 (*)    All other components within normal limits  BLOOD GAS, VENOUS - Abnormal; Notable for the following components:   pH, Ven 7.2 (*)    pCO2, Ven 29 (*)    pO2, Ven <31 (*)    Bicarbonate 11.3 (*)    Acid-base deficit 15.3 (*)    All other components within normal limits  SALICYLATE LEVEL - Abnormal; Notable for the following components:   Salicylate Lvl <7.0 (*)    All other components within normal limits  CBG MONITORING, ED - Abnormal; Notable for the following components:   Glucose-Capillary 113 (*)    All other components within normal limits  ETHANOL  URINALYSIS, ROUTINE W REFLEX MICROSCOPIC  BETA-HYDROXYBUTYRIC ACID  I-STAT CG4 LACTIC ACID, ED    EKG None  Radiology CT ABDOMEN PELVIS W CONTRAST  Result Date: 11/14/2022 CLINICAL DATA:  Nausea and vomiting for a week. EXAM: CT ABDOMEN AND PELVIS WITH CONTRAST TECHNIQUE: Multidetector CT imaging of the abdomen and pelvis was performed using the standard protocol following bolus administration of intravenous contrast. RADIATION DOSE REDUCTION: This exam was performed according to the departmental dose-optimization program which includes automated exposure control, adjustment of the mA and/or kV according to patient size and/or use of iterative reconstruction technique. CONTRAST:  OMNIPAQUE IOHEXOL 300 MG/ML  SOLN COMPARISON:  None Available. FINDINGS: Lower chest: Breast implants are noted at the edge of the imaging field. Lung bases are clear. No pleural effusion. Hepatobiliary: Diffuse fatty liver infiltration. Patent portal vein. Gallbladder is nondilated. Pancreas: Unremarkable. No pancreatic ductal dilatation or surrounding inflammatory changes. Spleen: Normal in size without focal abnormality. Tiny splenule anteriorly. Adrenals/Urinary Tract: Slight thickening of the left adrenal gland with a focal  area measuring 10 mm maximally. Hounsfield unit on portal venous phase of 96 and delayed of 48. relative washout consistent with an adenoma. 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 No specific imaging follow-up. Otherwise no enhancing mass or collecting system dilatation. The ureters have normal course and caliber extending down to the  urinary bladder. Preserved contours of the urinary bladder. Stomach/Bowel: On this non oral contrast exam, the large bowel has a normal course and caliber with scattered stool. Normal appendix. Stomach and small bowel are nondilated. Vascular/Lymphatic: Aortic atherosclerosis. No enlarged abdominal or pelvic lymph nodes. Reproductive: Status post hysterectomy. No adnexal masses. Other: No free air or free fluid. Musculoskeletal: No acute or significant osseous findings. IMPRESSION: No bowel obstruction, free air or free fluid.  Normal appendix. Diffuse fatty liver infiltration Electronically Signed   By: Karen Kays M.D.   On: 11/14/2022 09:17    Procedures .Critical Care  Performed by: Virgina Norfolk, DO Authorized by: Virgina Norfolk, DO   Critical care provider statement:    Critical care time (minutes):  40   Critical care was necessary to treat or prevent imminent or life-threatening deterioration of the following conditions: euglycemic dka.   Critical care was time spent personally by me on the following activities:  Blood draw for specimens, development of treatment plan with patient or surrogate, discussions with primary provider, examination of patient, evaluation of patient's response to treatment, obtaining history from patient or surrogate, gastric intubation, ordering and performing treatments and interventions, ordering and review of laboratory studies, ordering and review of radiographic studies, pulse oximetry, re-evaluation of patient's  condition and review of old charts   I assumed direction of critical care for this patient from another provider in my specialty: no     Care discussed with: admitting provider       Medications Ordered in ED Medications  dextrose 50 % solution 25 g (has no administration in time range)  dextrose 5 %-0.9 % sodium chloride infusion (has no administration in time range)  ondansetron (ZOFRAN) injection 4 mg (4 mg Intravenous Given 11/14/22 0733)  sodium chloride 0.9 % bolus 1,000 mL (0 mLs Intravenous Stopped 11/14/22 0843)  iohexol (OMNIPAQUE) 300 MG/ML solution 100 mL (100 mLs Intravenous Contrast Given 11/14/22 0826)  sodium chloride 0.9 % bolus 1,000 mL (1,000 mLs Intravenous New Bag/Given 11/14/22 1610)    ED Course/ Medical Decision Making/ A&P                                 Medical Decision Making Amount and/or Complexity of Data Reviewed Labs: ordered. Radiology: ordered.  Risk Prescription drug management. Decision regarding hospitalization.   Leslie Sullivan is here with nausea and vomiting.  History of hypertension and breast cancer.  Normal vitals.  No fever.  She is mildly tachycardic however.  Symptoms started since her last Mounjaro shot.  Differential diagnosis could be medication side effect versus GI illness versus colitis versus less likely bowel obstruction, infectious process.  Will get CBC CMP lipase urinalysis CT scan abdomen pelvis.  Will give IV fluids and IV Zofran and reevaluate.  Per my review interpretation of labs her anion gap is 28.  Her bicarb is 10.  Otherwise electrolytes unremarkable including her glucose which is about 106.  Bilirubin is 2.1.  Liver enzymes mildly elevated at 7924.  She has no significant leukocytosis or anemia otherwise.  Lipase 54.  Overall we will recheck the CMP and check a lactic acid, blood gas, ethanol level, salicylate level.  Patient pending CT scan of abdomen and pelvis.  This could be a euglycemic DKA.  Will give an additional  fluid bolus and reevaluate after labs are repeated.  CT scan unremarkable per radiology report.  Repeat labs to confirm an anion  gap now of 21.  Bicarb was 10.  Lactic acid is 1.  pH is 7.2.  Overall I do suspect euglycemic DKA or starvation ketosis..  Will start dextrose and more fluids.  Patient admitted to hospitalist.  Will hold on insulin at this time.  This chart was dictated using voice recognition software.  Despite best efforts to proofread,  errors can occur which can change the documentation meaning.         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

## 2022-11-14 NOTE — Plan of Care (Signed)

## 2022-11-15 DIAGNOSIS — E8889 Other specified metabolic disorders: Secondary | ICD-10-CM

## 2022-11-15 LAB — COMPREHENSIVE METABOLIC PANEL
ALT: 76 U/L — ABNORMAL HIGH (ref 0–44)
AST: 44 U/L — ABNORMAL HIGH (ref 15–41)
Albumin: 3.4 g/dL — ABNORMAL LOW (ref 3.5–5.0)
Alkaline Phosphatase: 45 U/L (ref 38–126)
Anion gap: 12 (ref 5–15)
BUN: 6 mg/dL (ref 6–20)
CO2: 16 mmol/L — ABNORMAL LOW (ref 22–32)
Calcium: 8.5 mg/dL — ABNORMAL LOW (ref 8.9–10.3)
Chloride: 107 mmol/L (ref 98–111)
Creatinine, Ser: 0.59 mg/dL (ref 0.44–1.00)
GFR, Estimated: >60 mL/min (ref >60)
Glucose, Bld: 111 mg/dL — ABNORMAL HIGH (ref 70–99)
Potassium: 3.7 mmol/L (ref 3.5–5.1)
Sodium: 135 mmol/L (ref 135–145)
Total Bilirubin: 1.6 mg/dL — ABNORMAL HIGH (ref 0.3–1.2)
Total Protein: 6.3 g/dL — ABNORMAL LOW (ref 6.5–8.1)

## 2022-11-15 LAB — CBC
HCT: 37.3 % (ref 36.0–46.0)
Hemoglobin: 11.9 g/dL — ABNORMAL LOW (ref 12.0–15.0)
MCH: 31.5 pg (ref 26.0–34.0)
MCHC: 31.9 g/dL (ref 30.0–36.0)
MCV: 98.7 fL (ref 80.0–100.0)
Platelets: 127 10*3/uL — ABNORMAL LOW (ref 150–400)
RBC: 3.78 MIL/uL — ABNORMAL LOW (ref 3.87–5.11)
RDW: 13.2 % (ref 11.5–15.5)
WBC: 4.5 10*3/uL (ref 4.0–10.5)
nRBC: 0 % (ref 0.0–0.2)

## 2022-11-15 LAB — GLUCOSE, CAPILLARY
Glucose-Capillary: 100 mg/dL — ABNORMAL HIGH (ref 70–99)
Glucose-Capillary: 106 mg/dL — ABNORMAL HIGH (ref 70–99)

## 2022-11-15 LAB — HIV ANTIBODY (ROUTINE TESTING W REFLEX): HIV Screen 4th Generation wRfx: NONREACTIVE

## 2022-11-15 MED ORDER — ONDANSETRON HCL 4 MG PO TABS: 4.0000 mg | ORAL_TABLET | Freq: Every day | ORAL | 1 refills | Status: AC | PRN

## 2022-11-15 NOTE — Progress Notes (Signed)
Patient had full liquid diet (grits) for breakfast- tolerated well. Diet then advanced to Soft Diet for lunch. Pt ate chicken and rice with no difficulty. Denied any pain, N/V. Pt "amazed" at how much better she felt from admission. Requesting to D/C. Pt assisted to exit via W/C by RN, D/C with husband.

## 2022-11-15 NOTE — Discharge Summary (Addendum)
Physician Discharge Summary  Archie Atilano MVH:[id number] DOB: 11/03/1970 DOA: 11/14/2022  PCP: Avanell Shackleton, NP-C  Admit date: 11/14/2022 Discharge date: 11/15/2022  Admitted From: Home Disposition: Home  Recommendations for Outpatient Follow-up:  Follow up with PCP in 1 week  Discharge Condition: Stable CODE STATUS: Full code Diet recommendation: Carb modified diet  Brief/Interim Summary: From H&P: Leslie Sullivan is a 52 y.o. female with medical history significant of alcohol use, abnormal LFTs, anxiety, depression in remission, breast cancer, varicella-zoster, tubal ectopic pregnancy, hypertension, class II obesity, hyperlipidemia, prediabetes, right lumbar radiculopathy who presented to the emergency department complaints of nausea, emesis for about a week since she restarted Mounjaro again.  She used the lowest dose that was 2.5 mg.  She had been using days earlier in the year but had several months without using it.  Her emesis content was mainly yellowish fluid, but stated that this morning she had brown looking emesis.  No history of PUD, GERD or gastritis.  She denied fever, chills, rhinorrhea, sore throat, wheezing or hemoptysis.  She has noticed some hyperventilation and mild dyspnea at times.  She also has had some palpitations, but no chest pain, diaphoresis, PND, orthopnea or pitting edema of the lower extremities.  No abdominal pain, diarrhea, constipation, melena or hematochezia.  No flank pain, dysuria, frequency or hematuria.  No polyuria, polydipsia, polyphagia or blurred vision.    Lab work: CBC showed a white count of 9.1, hemoglobin 15.9 g/dL and platelets 841.  Lipase was 54 units/L.  Unremarkable lactic acid salicylate and acetaminophen level.  CMP showed a sodium of 133, potassium 4.3, chloride 95 and CO2 10 mmol/L with an anion gap of 28.  Glucose 106, BUN 9, creatinine 0.98 and calcium 9.7 mg/dL.  Total protein 9.2 and albumin 5.2 g/dL.  AST 70 and ALT 124 units/L.  Total  bilirubin was 2.1 mg/dL.  Alkaline phosphatase was normal.   Imaging: CT abdomen/pelvis with contrast with no bowel obstruction, free air or free fluid.  Appendix was normal.  There was diffuse fatty liver infiltration.  Left adrenal gland had a focal area measuring 10 mm maximally.  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  No specific follow-up recommended by radiology.   ED course: Initial vital signs were temperature 98.1 F, pulse 121, respiration 19, BP 142/92 mmHg O2 sat 99% on room air.  The patient received ondansetron 4 mg IVP and 1000 mL of normal saline bolus.  Patient doing much better this morning.  She tolerated full liquid diet without any issues.  Diet advanced and patient discharged home and was recommended to discontinue terzepatide and switch to another anti-obesity agent.   Discharge Diagnoses:   Principal Problem:   Ketosis (HCC) Active Problems:   Hypertension   Acquired hypothyroidism   Dyslipidemia   Elevated LFTs   Prediabetes   Alcohol use   Class 2 obesity   Hepatic steatosis   Hyponatremia   Metabolic acidosis with respiratory alkalosis    Discharge Instructions  Discharge Instructions     Call MD for:  difficulty breathing, headache or visual disturbances   Complete by: As directed    Call MD for:  extreme fatigue   Complete by: As directed    Call MD for:  persistant dizziness or light-headedness   Complete by: As directed    Call MD for:  persistant nausea and vomiting   Complete by: As directed    Call MD for:  severe uncontrolled pain   Complete by:  As directed    Call MD for:  temperature >100.4   Complete by: As directed    Discharge instructions   Complete by: As directed    You were cared for by a hospitalist during your hospital stay. If you have any questions about your discharge medications or the care you received while you were in the hospital after you are discharged, you can call the unit and ask to speak with the  hospitalist on call if the hospitalist that took care of you is not available. Once you are discharged, your primary care physician will handle any further medical issues. Please note that NO REFILLS for any discharge medications will be authorized once you are discharged, as it is imperative that you return to your primary care physician (or establish a relationship with a primary care physician if you do not have one) for your aftercare needs so that they can reassess your need for medications and monitor your lab values.   Increase activity slowly   Complete by: As directed       Allergies as of 11/15/2022       Reactions   Hydrocodone-acetaminophen Nausea And Vomiting        Medication List     STOP taking these medications    Zepbound 2.5 MG/0.5ML Pen Generic drug: tirzepatide       TAKE these medications    losartan 25 MG tablet Commonly known as: COZAAR Take 25 mg by mouth daily.   NP Thyroid 30 MG tablet Generic drug: thyroid Take 30 mg by mouth daily before breakfast.   ondansetron 4 MG tablet Commonly known as: Zofran Take 1 tablet (4 mg total) by mouth daily as needed for nausea or vomiting.   progesterone 100 MG capsule Commonly known as: PROMETRIUM Take 1 capsule (100 mg total) by mouth at bedtime.   Vitamin B Complex Tabs Take 1 tablet by mouth daily.   VITAMIN D PO Take 1 tablet by mouth daily.        Follow-up Information     Henson, Vickie L, NP-C Follow up.   Specialty: Family Medicine Contact information: [address] [place] [address] [phone]                Allergies  Allergen Reactions   Hydrocodone-Acetaminophen Nausea And Vomiting     Procedures/Studies: CT ABDOMEN PELVIS W CONTRAST  Result Date: 11/14/2022 CLINICAL DATA:  Nausea and vomiting for a week. EXAM: CT ABDOMEN AND PELVIS WITH CONTRAST TECHNIQUE: Multidetector CT imaging of the abdomen and pelvis was performed using the standard protocol  following bolus administration of intravenous contrast. RADIATION DOSE REDUCTION: This exam was performed according to the departmental dose-optimization program which includes automated exposure control, adjustment of the mA and/or kV according to patient size and/or use of iterative reconstruction technique. CONTRAST:  OMNIPAQUE IOHEXOL 300 MG/ML  SOLN COMPARISON:  None Available. FINDINGS: Lower chest: Breast implants are noted at the edge of the imaging field. Lung bases are clear. No pleural effusion. Hepatobiliary: Diffuse fatty liver infiltration. Patent portal vein. Gallbladder is nondilated. Pancreas: Unremarkable. No pancreatic ductal dilatation or surrounding inflammatory changes. Spleen: Normal in size without focal abnormality. Tiny splenule anteriorly. Adrenals/Urinary Tract: Slight thickening of the left adrenal gland with a focal area measuring 10 mm maximally. Hounsfield unit on portal venous phase of 96 and delayed of 48. relative washout consistent with an adenoma. 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 No specific imaging follow-up. Otherwise no enhancing mass or collecting system dilatation. The ureters have normal course and caliber extending down to the urinary bladder. Preserved contours of the urinary bladder. Stomach/Bowel: On this non oral contrast exam, the large bowel has a normal course and caliber with scattered stool. Normal appendix. Stomach and small bowel are nondilated. Vascular/Lymphatic: Aortic atherosclerosis. No enlarged abdominal or pelvic lymph nodes. Reproductive: Status post hysterectomy. No adnexal masses. Other: No free air or free fluid. Musculoskeletal: No acute or significant osseous findings. IMPRESSION: No bowel obstruction, free air or free fluid.  Normal appendix. Diffuse fatty liver infiltration Electronically Signed   By: Karen Kays M.D.    On: 11/14/2022 09:17       Discharge Exam: Vitals:   11/15/22 0150 11/15/22 0518  BP: (!) 122/93 (!) 111/58  Pulse: 76 77  Resp: 18 20  Temp: 98.5 F (36.9 C) 98.3 F (36.8 C)  SpO2: 97% 98%    General: Pt is alert, awake, not in acute distress Cardiovascular: RRR, S1/S2 +, no edema Respiratory: CTA bilaterally, no wheezing, no rhonchi, no respiratory distress, no conversational dyspnea  Abdominal: Soft, NT, ND, bowel sounds + Extremities: no edema, no cyanosis Psych: Normal mood and affect, stable judgement and insight     The results of significant diagnostics from this hospitalization (including imaging, microbiology, ancillary and laboratory) are listed below for reference.     Microbiology: No results found for this or any previous visit (from the past 240 hour(s)).   Labs: BNP (last 3 results) No results for input(s): "BNP" in the last 8760 hours. Basic Metabolic Panel: Recent Labs  Lab 11/14/22 0915 11/14/22 1330 11/14/22 1644 11/14/22 2133 11/15/22 0352  NA 128* 132* 136 133* 135  K 4.3 3.9 3.7 3.8 3.7  CL 97* 106 108 106 107  CO2 10* 13* 16* 17* 16*  GLUCOSE 89 341* 181* 117* 111*  BUN 10 9 9 7 6   CREATININE 0.94 0.88 0.67 0.72 0.59  CALCIUM 8.4* 7.8* 8.1* 8.4* 8.5*  MG 2.2  --   --   --   --    Liver Function Tests: Recent Labs  Lab 11/14/22 0659 11/14/22 0915 11/15/22 0352  AST 70* 57* 44*  ALT 124* 99* 76*  ALKPHOS 68 56 45  BILITOT 2.1* 2.3* 1.6*  PROT 9.2* 7.8 6.3*  ALBUMIN 5.2* 4.2 3.4*   Recent Labs  Lab 11/14/22 0659  LIPASE 54*   No results for input(s): "AMMONIA" in the last 168 hours. CBC: Recent Labs  Lab 11/14/22 0659 11/15/22 0352  WBC 9.1 4.5  HGB 15.9* 11.9*  HCT 49.5* 37.3  MCV 98.6 98.7  PLT 194 127*   Cardiac Enzymes: No results for input(s): "CKTOTAL", "CKMB", "CKMBINDEX", "TROPONINI" in the last 168 hours. BNP: Invalid input(s): "POCBNP" CBG: Recent Labs  Lab 11/14/22 0629 11/14/22 1428  11/14/22 2143 11/15/22 0806 11/15/22 1117  GLUCAP 113* 234* 124* 100* 106*   D-Dimer No results for input(s): "DDIMER" in the last 72 hours. Hgb A1c Recent Labs    11/14/22 0659  HGBA1C 5.4   Lipid Profile No results for input(s): "CHOL", "HDL", "LDLCALC", "TRIG", "CHOLHDL", "LDLDIRECT" in the last 72 hours. Thyroid function studies Recent Labs    11/14/22 0915  TSH 1.027   Anemia work up No results for input(s): "VITAMINB12", "FOLATE", "FERRITIN", "TIBC", "IRON", "RETICCTPCT" in the last 72 hours. Urinalysis    Component Value Date/Time  COLORURINE YELLOW 11/14/2022 1109   APPEARANCEUR CLEAR 11/14/2022 1109   LABSPEC 1.025 11/14/2022 1109   PHURINE 5.0 11/14/2022 1109   GLUCOSEU NEGATIVE 11/14/2022 1109   HGBUR NEGATIVE 11/14/2022 1109   BILIRUBINUR NEGATIVE 11/14/2022 1109   BILIRUBINUR neg 03/06/2014 1034   KETONESUR 80 (A) 11/14/2022 1109   PROTEINUR 30 (A) 11/14/2022 1109   UROBILINOGEN negative 03/06/2014 1034   UROBILINOGEN 0.2 10/05/2011 1934   NITRITE NEGATIVE 11/14/2022 1109   LEUKOCYTESUR NEGATIVE 11/14/2022 1109   Sepsis Labs Recent Labs  Lab 11/14/22 0659 11/15/22 0352  WBC 9.1 4.5   Microbiology No results found for this or any previous visit (from the past 240 hour(s)).   Patient was seen and examined on the day of discharge and was found to be in stable condition. Time coordinating discharge: 35 minutes including assessment and coordination of care, as well as examination of the patient.   SIGNED:  Noralee Stain, DO [facility] 11/15/2022, 12:36 PM

## 2022-11-16 ENCOUNTER — Other Ambulatory Visit: Payer: Self-pay

## 2022-12-04 ENCOUNTER — Other Ambulatory Visit (HOSPITAL_COMMUNITY): Payer: Self-pay

## 2022-12-04 ENCOUNTER — Other Ambulatory Visit: Payer: Self-pay | Admitting: Family Medicine

## 2022-12-04 MED ORDER — LOSARTAN POTASSIUM 25 MG PO TABS
25.0000 mg | ORAL_TABLET | Freq: Every day | ORAL | 0 refills | Status: DC
  Filled 2022-12-04: qty 90, 90d supply, fill #0

## 2022-12-17 ENCOUNTER — Other Ambulatory Visit: Payer: Self-pay | Admitting: Family Medicine

## 2022-12-17 ENCOUNTER — Other Ambulatory Visit (HOSPITAL_COMMUNITY): Payer: Self-pay

## 2022-12-17 MED ORDER — LOSARTAN POTASSIUM 25 MG PO TABS
25.0000 mg | ORAL_TABLET | Freq: Every day | ORAL | 0 refills | Status: DC
  Filled 2022-12-24 – 2023-04-07 (×2): qty 90, 90d supply, fill #0

## 2022-12-24 ENCOUNTER — Other Ambulatory Visit: Payer: Self-pay

## 2022-12-24 ENCOUNTER — Other Ambulatory Visit (HOSPITAL_COMMUNITY): Payer: Self-pay

## 2022-12-29 ENCOUNTER — Ambulatory Visit: Payer: BLUE CROSS/BLUE SHIELD | Admitting: Family Medicine

## 2023-04-07 ENCOUNTER — Other Ambulatory Visit (HOSPITAL_COMMUNITY): Payer: Self-pay

## 2023-04-07 MED ORDER — MOUNJARO 2.5 MG/0.5ML ~~LOC~~ SOAJ
2.5000 mg | SUBCUTANEOUS | 1 refills | Status: DC
  Filled 2023-04-07: qty 2, 28d supply, fill #0
  Filled 2023-05-03: qty 2, 28d supply, fill #1

## 2023-05-03 ENCOUNTER — Other Ambulatory Visit (HOSPITAL_COMMUNITY): Payer: Self-pay

## 2023-05-04 ENCOUNTER — Other Ambulatory Visit (HOSPITAL_COMMUNITY): Payer: Self-pay

## 2023-05-31 ENCOUNTER — Other Ambulatory Visit (HOSPITAL_COMMUNITY): Payer: Self-pay

## 2023-05-31 MED ORDER — TIRZEPATIDE 2.5 MG/0.5ML ~~LOC~~ SOAJ
2.5000 mg | SUBCUTANEOUS | 1 refills | Status: AC
  Filled 2023-05-31 – 2023-12-30 (×4): qty 2, 28d supply, fill #0

## 2023-06-01 ENCOUNTER — Other Ambulatory Visit (HOSPITAL_COMMUNITY): Payer: Self-pay

## 2023-06-07 ENCOUNTER — Other Ambulatory Visit (HOSPITAL_COMMUNITY): Payer: Self-pay

## 2023-06-07 MED ORDER — PROGESTERONE 200 MG PO CAPS
200.0000 mg | ORAL_CAPSULE | Freq: Every day | ORAL | 2 refills | Status: AC
  Filled 2023-06-07: qty 90, 90d supply, fill #0

## 2023-06-08 ENCOUNTER — Other Ambulatory Visit (HOSPITAL_COMMUNITY): Payer: Self-pay

## 2023-08-06 ENCOUNTER — Other Ambulatory Visit: Payer: Self-pay | Admitting: Family Medicine

## 2023-08-06 ENCOUNTER — Other Ambulatory Visit (HOSPITAL_COMMUNITY): Payer: Self-pay

## 2023-08-06 MED ORDER — LOSARTAN POTASSIUM 25 MG PO TABS
25.0000 mg | ORAL_TABLET | Freq: Every day | ORAL | 0 refills | Status: AC
  Filled 2023-08-06: qty 90, 90d supply, fill #0

## 2023-12-30 ENCOUNTER — Other Ambulatory Visit (HOSPITAL_COMMUNITY): Payer: Self-pay

## 2024-02-21 ENCOUNTER — Other Ambulatory Visit (HOSPITAL_COMMUNITY): Payer: Self-pay

## 2024-02-21 ENCOUNTER — Other Ambulatory Visit: Payer: Self-pay

## 2024-02-21 ENCOUNTER — Emergency Department (HOSPITAL_COMMUNITY)

## 2024-02-21 ENCOUNTER — Emergency Department (HOSPITAL_COMMUNITY)
Admission: EM | Admit: 2024-02-21 | Discharge: 2024-02-21 | Disposition: A | Discharge status: Home / Self Care | Attending: Emergency Medicine | Admitting: Emergency Medicine

## 2024-02-21 ENCOUNTER — Encounter (HOSPITAL_COMMUNITY): Payer: Self-pay

## 2024-02-21 DIAGNOSIS — Z79899 Other long term (current) drug therapy: Secondary | ICD-10-CM | POA: Diagnosis not present

## 2024-02-21 DIAGNOSIS — E860 Dehydration: Secondary | ICD-10-CM | POA: Insufficient documentation

## 2024-02-21 DIAGNOSIS — R197 Diarrhea, unspecified: Secondary | ICD-10-CM | POA: Insufficient documentation

## 2024-02-21 DIAGNOSIS — I10 Essential (primary) hypertension: Secondary | ICD-10-CM | POA: Diagnosis not present

## 2024-02-21 DIAGNOSIS — R112 Nausea with vomiting, unspecified: Secondary | ICD-10-CM | POA: Insufficient documentation

## 2024-02-21 LAB — CBC
HCT: 46.1 % — ABNORMAL HIGH (ref 36.0–46.0)
Hemoglobin: 14.8 g/dL (ref 12.0–15.0)
MCH: 31.6 pg (ref 26.0–34.0)
MCHC: 32.1 g/dL (ref 30.0–36.0)
MCV: 98.5 fL (ref 80.0–100.0)
Platelets: 146 K/uL — ABNORMAL LOW (ref 150–400)
RBC: 4.68 MIL/uL (ref 3.87–5.11)
RDW: 14.3 % (ref 11.5–15.5)
WBC: 4.9 K/uL (ref 4.0–10.5)
nRBC: 0 % (ref 0.0–0.2)

## 2024-02-21 LAB — COMPREHENSIVE METABOLIC PANEL WITH GFR
ALT: 181 U/L — ABNORMAL HIGH (ref 0–44)
AST: 166 U/L — ABNORMAL HIGH (ref 15–41)
Albumin: 4.6 g/dL (ref 3.5–5.0)
Alkaline Phosphatase: 79 U/L (ref 38–126)
Anion gap: 23 — ABNORMAL HIGH (ref 5–15)
BUN: 6 mg/dL (ref 6–20)
CO2: 20 mmol/L — ABNORMAL LOW (ref 22–32)
Calcium: 10 mg/dL (ref 8.9–10.3)
Chloride: 93 mmol/L — ABNORMAL LOW (ref 98–111)
Creatinine, Ser: 0.6 mg/dL (ref 0.44–1.00)
GFR, Estimated: >60 mL/min
Glucose, Bld: 70 mg/dL (ref 70–99)
Potassium: 4.4 mmol/L (ref 3.5–5.1)
Sodium: 136 mmol/L (ref 135–145)
Total Bilirubin: 1.1 mg/dL (ref 0.0–1.2)
Total Protein: 7.7 g/dL (ref 6.5–8.1)

## 2024-02-21 LAB — BLOOD GAS, VENOUS
Acid-base deficit: 3.4 mmol/L — ABNORMAL HIGH (ref 0.0–2.0)
Bicarbonate: 21.4 mmol/L (ref 20.0–28.0)
O2 Saturation: 45.7 %
Patient temperature: 37
pCO2, Ven: 37 mmHg — ABNORMAL LOW (ref 44–60)
pH, Ven: 7.37 (ref 7.25–7.43)
pO2, Ven: <31 mmHg — CL (ref 32–45)

## 2024-02-21 LAB — URINALYSIS, ROUTINE W REFLEX MICROSCOPIC
Bilirubin Urine: NEGATIVE
Glucose, UA: NEGATIVE mg/dL
Hgb urine dipstick: NEGATIVE
Ketones, ur: 80 mg/dL — AB
Leukocytes,Ua: NEGATIVE
Nitrite: NEGATIVE
Protein, ur: NEGATIVE mg/dL
Specific Gravity, Urine: 1.016 (ref 1.005–1.030)
pH: 6 (ref 5.0–8.0)

## 2024-02-21 LAB — LIPASE, BLOOD: Lipase: 55 U/L — ABNORMAL HIGH (ref 11–51)

## 2024-02-21 MED ORDER — LORAZEPAM 1 MG PO TABS: 1.0000 mg | ORAL_TABLET | ORAL | Status: DC | PRN

## 2024-02-21 MED ORDER — IOHEXOL 300 MG/ML  SOLN
100.0000 mL | Freq: Once | INTRAMUSCULAR | Status: AC | PRN
  Administered 2024-02-21: 100 mL via INTRAVENOUS

## 2024-02-21 MED ORDER — LACTATED RINGERS IV BOLUS
1000.0000 mL | Freq: Once | INTRAVENOUS | Status: AC
  Administered 2024-02-21: 1000 mL via INTRAVENOUS

## 2024-02-21 MED ORDER — THIAMINE MONONITRATE 100 MG PO TABS
100.0000 mg | ORAL_TABLET | Freq: Every day | ORAL | Status: DC
  Filled 2024-02-21: qty 1

## 2024-02-21 MED ORDER — ADULT MULTIVITAMIN W/MINERALS CH
1.0000 | ORAL_TABLET | Freq: Every day | ORAL | Status: DC
  Administered 2024-02-21: 1 via ORAL
  Filled 2024-02-21: qty 1

## 2024-02-21 MED ORDER — ONDANSETRON HCL 4 MG/2ML IJ SOLN
4.0000 mg | Freq: Once | INTRAMUSCULAR | Status: AC
  Administered 2024-02-21: 4 mg via INTRAVENOUS
  Filled 2024-02-21: qty 2

## 2024-02-21 MED ORDER — LORAZEPAM 2 MG/ML IJ SOLN: 1.0000 mg | INTRAMUSCULAR | Status: DC | PRN

## 2024-02-21 MED ORDER — LORAZEPAM 2 MG/ML IJ SOLN
0.5000 mg | Freq: Once | INTRAMUSCULAR | Status: AC
  Administered 2024-02-21: 0.5 mg via INTRAVENOUS
  Filled 2024-02-21: qty 1

## 2024-02-21 MED ORDER — FOLIC ACID 1 MG PO TABS: 1.0000 mg | ORAL_TABLET | Freq: Every day | ORAL | Status: DC

## 2024-02-21 MED ORDER — ONDANSETRON 4 MG PO TBDP
4.0000 mg | ORAL_TABLET | Freq: Three times a day (TID) | ORAL | 0 refills | Status: AC | PRN
  Filled 2024-02-21: qty 20, 7d supply, fill #0

## 2024-02-21 MED ORDER — THIAMINE HCL 100 MG/ML IJ SOLN
100.0000 mg | Freq: Every day | INTRAMUSCULAR | Status: DC
  Administered 2024-02-21: 100 mg via INTRAVENOUS
  Filled 2024-02-21: qty 2

## 2024-02-21 MED ORDER — CHLORDIAZEPOXIDE HCL 25 MG PO CAPS
ORAL_CAPSULE | ORAL | 0 refills | Status: AC
  Filled 2024-02-21: qty 10, 2d supply, fill #0

## 2024-02-21 NOTE — Progress Notes (Signed)
 ICM consulted for substance abuse resources. Resources provided and attached to AVS.

## 2024-02-21 NOTE — ED Provider Triage Note (Signed)
 Emergency Medicine Provider Triage Evaluation Note  Leslie Sullivan , a 54 y.o. female  was evaluated in triage.  Pt complains of N/V/D.  Started about a week ago.  Denies abdominal pain.  Denies urinary or vaginal symptoms.  Also reports that she drinks 3-4 cocktails a day but last drink was 2 days ago since she has been vomiting.  Denies fever.  Review of Systems  Positive: See above Negative: See above  Physical Exam  BP 132/84   Pulse 79   Temp 98.5 F (36.9 C) (Oral)   Resp 16   Ht 5' 6 (1.676 m)   Wt 90.7 kg   LMP 04/10/2010   SpO2 100%   BMI 32.28 kg/m  Gen:   Awake, no distress   Resp:  Normal effort  MSK:   Moves extremities without difficulty  Other:  Hands are tremulous, tongue fasciculations noted, abdomen soft and nontender  Medical Decision Making  Medically screening exam initiated at 1:13 PM.  Appropriate orders placed.  Leslie Sullivan was informed that the remainder of the evaluation will be completed by another provider, this initial triage assessment does not replace that evaluation, and the importance of remaining in the ED until their evaluation is complete.  Work up started   Leslie Sullivan POUR, PA-C 02/21/24 1314

## 2024-02-21 NOTE — ED Provider Notes (Signed)
 " [place] EMERGENCY DEPARTMENT AT [facility] Provider Note   CSN: [id number] Arrival date & time: 02/21/24  9150     Patient presents with: Emesis   Leslie Sullivan is a 54 y.o. female.   Patient is here for evaluation of N/V/D x 2 to 3 weeks. Patient is accompanied by her husband. Patient came in today as vomiting has worsened today, with 3 episodes of emesis today.  She has been eating primarily bone broth for the last 2 to 3 weeks as other foods are not as appetizing.  Denies abdominal pain.  She does report some dizziness with no episodes of syncope.  She reports palpitations primarily in the morning that quickly resolve.  She denies any shortness of breath or chest pain related to these palpitations.  Nausea vomiting does not seem to be associated with food intake.  She does feel nauseated with vomiting with taking daily medications like lisinopril on daily supplements. Denies dysuria or hematuria. Denies recent fevers; does note fever for about two days at symptom onset about two weeks ago. Denies leg swelling. Last bowel movement two days ago with minimal flatulence.  She was hospitalized in October 2024 secondary to metabolic acidosis related to beginning Mounjaro .  She denies any recent change in medications.  Of note, patient regularly drinks alcohol, being about 3-4 cocktails daily.  Described as three to four 20 ounce yeti thermos's with vodka and water.  She is been drinking this amount of alcohol for at least 10 years.  She denies any history of pancreatitis or pancreatic issues.  She has been through withdrawal symptoms in the past.  Last alcohol intake was 2 days ago.  She cannot say if current shaking is secondary to withdrawals or current illness.  The history is provided by the patient.  Emesis Severity:  Moderate      Prior to Admission medications  Medication Sig Start Date End Date Taking? Authorizing Provider  chlordiazePOXIDE  (LIBRIUM ) 25 MG capsule 50mg   PO TID x 1D, then 25-50mg  PO BID X 1D, then 25-50mg  PO QD X 1D 02/21/24  Yes Leslie Almarie LABOR, PA-C  ondansetron  (ZOFRAN -ODT) 4 MG disintegrating tablet Take 1 tablet (4 mg total) by mouth every 8 (eight) hours as needed for nausea or vomiting. 02/21/24  Yes Leslie Almarie A, PA-C  B Complex Vitamins (VITAMIN B COMPLEX) TABS Take 1 tablet by mouth daily.    [provider]  losartan  (COZAAR ) 25 MG tablet Take 1 tablet (25 mg total) by mouth daily. 08/06/23   Henson, Vickie L, NP-C  progesterone  (PROMETRIUM ) 100 MG capsule Take 1 capsule (100 mg total) by mouth at bedtime. 09/25/22     progesterone  (PROMETRIUM ) 200 MG capsule Take 1 capsule (200 mg total) by mouth at bedtime. 06/07/23     thyroid  (NP THYROID ) 30 MG tablet Take 30 mg by mouth daily before breakfast.    [provider]  tirzepatide  (MOUNJARO ) 2.5 MG/0.5ML Pen Inject 2.5 mg into the skin once a week. 05/31/23     VITAMIN D PO Take 1 tablet by mouth daily.    [provider]    Allergies: Hydrocodone -acetaminophen     Review of Systems  Gastrointestinal:  Positive for vomiting.   Vitals:   02/21/24 0853 02/21/24 1119 02/21/24 1506 02/21/24 1527  BP: (!) 161/77 132/84  (!) 144/92  Pulse: 83 79  98  Resp: 16 16  17   Temp: 98.2 F (36.8 C) 98.5 F (36.9 C)  98.2 F (36.8 C)  TempSrc: Oral Oral  Oral  SpO2: 98% 100% 98% 97%  Weight: 90.7 kg     Height: 5' 6 (1.676 m)       Updated Vital Signs BP (!) 144/92 (BP Location: Left Arm)   Pulse 98   Temp 98.2 F (36.8 C) (Oral)   Resp 17   Ht 5' 6 (1.676 m)   Wt 90.7 kg   LMP 04/10/2010   SpO2 97%   BMI 32.28 kg/m   Physical Exam Vitals and nursing note reviewed.  Constitutional:      General: She is not in acute distress.    Appearance: Normal appearance. She is not diaphoretic.     Comments: Bilateral hand shaking  HENT:     Head: Normocephalic and atraumatic.     Nose: Nose normal.     Mouth/Throat:     Mouth: Mucous membranes are  moist.  Eyes:     General: No scleral icterus.    Extraocular Movements: Extraocular movements intact.     Conjunctiva/sclera: Conjunctivae normal.     Pupils: Pupils are equal, round, and reactive to light.  Cardiovascular:     Rate and Rhythm: Normal rate and regular rhythm.     Pulses: Normal pulses.     Heart sounds: Normal heart sounds.  Pulmonary:     Effort: Pulmonary effort is normal. No respiratory distress.     Breath sounds: Normal breath sounds. No stridor. No wheezing, rhonchi or rales.  Abdominal:     General: Abdomen is flat. Bowel sounds are normal. There is no distension.     Palpations: Abdomen is soft.     Tenderness: There is no abdominal tenderness. There is no right CVA tenderness, left CVA tenderness or guarding.  Musculoskeletal:        General: Normal range of motion.     Cervical back: Normal range of motion and neck supple.     Right lower leg: No edema.     Left lower leg: No edema.  Skin:    General: Skin is warm and dry.     Capillary Refill: Capillary refill takes less than 2 seconds.     Coloration: Skin is not jaundiced or pale.     Findings: No bruising.  Neurological:     Mental Status: She is alert and oriented to person, place, and time.     (all labs ordered are listed, but only abnormal results are displayed) Labs Reviewed  LIPASE, BLOOD - Abnormal; Notable for the following components:      Result Value   Lipase 55 (*)    All other components within normal limits  COMPREHENSIVE METABOLIC PANEL WITH GFR - Abnormal; Notable for the following components:   Chloride 93 (*)    CO2 20 (*)    AST 166 (*)    ALT 181 (*)    Anion gap 23 (*)    All other components within normal limits  CBC - Abnormal; Notable for the following components:   HCT 46.1 (*)    Platelets 146 (*)    All other components within normal limits  URINALYSIS, ROUTINE W REFLEX MICROSCOPIC - Abnormal; Notable for the following components:   Ketones, ur 80 (*)    All  other components within normal limits  BLOOD GAS, VENOUS - Abnormal; Notable for the following components:   pCO2, Ven 37 (*)    pO2, Ven <31 (*)    Acid-base deficit 3.4 (*)    All other components within  normal limits    EKG: EKG Interpretation Date/Time:  Monday February 21 2024 15:40:07 EST Ventricular Rate:  90 PR Interval:  221 QRS Duration:  112 QT Interval:  406 QTC Calculation: 497 R Axis:   130  Text Interpretation: Sinus rhythm Prolonged PR interval Probable left atrial enlargement Borderline prolonged QT interval Confirmed by Cottie Cough [phone]) on 02/21/2024 4:29:29 PM  Radiology: CT ABDOMEN PELVIS W CONTRAST Result Date: 02/21/2024 CLINICAL DATA:  Acute abdominal pain, vomiting, weakness EXAM: CT ABDOMEN AND PELVIS WITH CONTRAST TECHNIQUE: Multidetector CT imaging of the abdomen and pelvis was performed using the standard protocol following bolus administration of intravenous contrast. RADIATION DOSE REDUCTION: This exam was performed according to the departmental dose-optimization program which includes automated exposure control, adjustment of the mA and/or kV according to patient size and/or use of iterative reconstruction technique. CONTRAST:  OMNIPAQUE  IOHEXOL  300 MG/ML  SOLN COMPARISON:  11/14/2022 FINDINGS: Lower chest: No acute pleural or parenchymal lung disease. Hepatobiliary: Severe hepatic steatosis. No focal liver abnormality. The gallbladder is unremarkable. Pancreas: Unremarkable. No pancreatic ductal dilatation or surrounding inflammatory changes. Spleen: Normal in size without focal abnormality. Adrenals/Urinary Tract: Stable appearance of the adrenals. Kidneys enhance normally. No urinary tract calculi or obstruction. Bladder is unremarkable. Stomach/Bowel: No bowel obstruction or ileus. Normal appendix right lower quadrant. No bowel wall thickening or inflammatory change. Vascular/Lymphatic: No significant vascular findings are present. No enlarged  abdominal or pelvic lymph nodes. Reproductive: Status post hysterectomy. No adnexal masses. Other: No free fluid or free intraperitoneal gas. No abdominal wall hernia. Musculoskeletal: No acute or destructive bony abnormalities. Reconstructed images demonstrate no additional findings. IMPRESSION: 1. No acute intra-abdominal or intrapelvic process. 2. Severe hepatic steatosis. Electronically Signed   By: Ozell Daring M.D.   On: 02/21/2024 18:13     Procedures   Medications Ordered in the ED  ondansetron  (ZOFRAN ) injection 4 mg (4 mg Intravenous Given 02/21/24 1558)  lactated ringers  bolus 1,000 mL (1,000 mLs Intravenous New Bag/Given 02/21/24 1604)  LORazepam  (ATIVAN ) injection 0.5 mg (0.5 mg Intravenous Given 02/21/24 1728)  iohexol  (OMNIPAQUE ) 300 MG/ML solution 100 mL (100 mLs Intravenous Contrast Given 02/21/24 1710)     Patient presents to the ED for concern of N/V/D, this involves an extensive number of treatment options, and is a complaint that carries with it a high risk of complications and morbidity.  The differential diagnosis includes pancreatitis, biliary disease, UTI, DKA, gastroenteritis, peptic ulcer disease, hepatitis, liver disease.    Co morbidities that complicate the patient evaluation  HTN Alcohol use, daily Prediabetes Elevated LFTs   Additional history obtained:  Additional history obtained from [facility], Outside Medical Records, and Past Admission   External records from outside source obtained and reviewed including additional history provided by husband at bedside, previous lab work for comparison, previous hospital admission in October 2024.   Lab Tests:  I Ordered, and personally interpreted labs.  The pertinent results include:  Elevated AST and ALT compared to previous labs in October 2024. Lipase is elevated at 55, though it is similar to previous 54 in October 2024. Anion gap of 23. Ketonuria.   Imaging Studies ordered:  I ordered imaging studies  including CT abdomen pelvis I independently visualized and interpreted imaging which showed no acute intra-abdominal or intrapelvic process.  There is severe hepatic steatosis seen. I agree with the radiologist interpretation   Cardiac Monitoring:  The patient was maintained on a cardiac monitor.  I personally viewed and interpreted the cardiac monitored which  showed an underlying rhythm of: Sinus rhythm with prolonged PR interval and no evidence of STEMI.   Medicines ordered and prescription drug management:  I ordered medication including LR bolus, Ativan , Zofran  for suspected dehydration, nausea, and alcohol withdrawals. Reevaluation of the patient after these medicines showed that the patient improved I have reviewed the patients home medicines and have made adjustments as needed   Problem List / ED Course:  Clinical Course as of 02/21/24 1902  Pablo Feb 21, 2024  1857 This is a 54 year old female presenting to ED with persistent stomach upset, nausea vomiting, difficult tolerating p.o.  She reports that been ongoing for weeks and cycles, but got a lot worse the past few days.  She has not had a bowel movement in 2 days and is not certain she is passing gas.  She denies any persistent abdominal pain at this time.  She does report that she drinks about 4 cocktails a day, and has been doing this for a long time, she often gets shaky if she does not drink.  Her last drink was yesterday.  She has tried quitting in the past, but resumed drinking again.  On exam the patient is generally well-appearing.  Very mild hypertension but vitals otherwise normal.  She does not have any significant tremulousness for me, or evidence of acute alcohol withdrawal.  She does not have abdominal tenderness on exam.  Her labs show a mild persistent transaminitis, slightly worsened than her last testing, consistent likely with her known fatty liver disease or alcoholic liver disease.  She has an elevated anion gap  related to dehydration.  She does not have a leukocytosis, has no acute anemia, and has a lipase that is unremarkable.  Venous gas with no acidosis, low suspicion for DKA.  Patient had CT imaging of the abdomen pelvis performed for bowel obstruction rule out.  Overall this is reassuring.  She does have known fatty liver disease or hepatic steatosis noted.  She was given IV fluids, Ativan  and Zofran , with improvement of her symptoms, tolerated p.o.  We discussed alcohol withdrawal management and can try a course of Librium  as she is interested in quitting drinking.  Her husband is present at the bedside as well.  I also discussed a referral to gastroenterology as she has had persistent issues with nausea and vomiting now for some months, and may have some gastroparesis type problems, which may need further evaluation. [MT]    Clinical Course User Index [MT] Cottie Donnice PARAS, MD    Elevated AST and ALT compared to previous labs in October 2024. Lipase is elevated at 55, though it is similar to previous 54 in October 2024. Anion gap of 23. Ketonuria. Venous blood gas shows normal pH and bicarbonate. Anion gap most likely the result of dehydration secondary to prolonged N/V/D and decreased appetite. Elevated LFTs likely secondary to severe hepatic steatosis and chronic alcohol use.  Dehydration, N/V/D. Labs, imaging, and EKG show no acute process. Fluids given. It is reassuring the patient tolerated PO intake with antiemetics onboard. Will send Zofran  to pharmacy. Encouraged follow-up with PCP and GI outpatient. Return precautions discussed and patient verbalized understanding. Clear for discharge. Severe hepatic steatosis and chronic alcohol use. Librium  prescription sent to pharmacy and substance abuse resource guides printed with dispo paperwork. Encouraged to follow-up in outpatient GI regarding today's symptoms and ongoing evaluation of hepatic steatosis.   Reevaluation:  After the interventions  noted above, I reevaluated the patient and found that they have :  improved   Dispostion:  After consideration of the diagnostic results and the patients response to treatment, I feel that the patent would benefit from supportive care in the home setting with antiemetics and librium  taper. Outpatient follow-up in GI and with PCP for ongoing evaluation and management. Return precautions given.   Clinical Course as of 02/21/24 RETHA Kitchens Feb 21, 2024  1857 This is a 54 year old female presenting to ED with persistent stomach upset, nausea vomiting, difficult tolerating p.o.  She reports that been ongoing for weeks and cycles, but got a lot worse the past few days.  She has not had a bowel movement in 2 days and is not certain she is passing gas.  She denies any persistent abdominal pain at this time.  She does report that she drinks about 4 cocktails a day, and has been doing this for a long time, she often gets shaky if she does not drink.  Her last drink was yesterday.  She has tried quitting in the past, but resumed drinking again.  On exam the patient is generally well-appearing.  Very mild hypertension but vitals otherwise normal.  She does not have any significant tremulousness for me, or evidence of acute alcohol withdrawal.  She does not have abdominal tenderness on exam.  Her labs show a mild persistent transaminitis, slightly worsened than her last testing, consistent likely with her known fatty liver disease or alcoholic liver disease.  She has an elevated anion gap related to dehydration.  She does not have a leukocytosis, has no acute anemia, and has a lipase that is unremarkable.  Venous gas with no acidosis, low suspicion for DKA.  Patient had CT imaging of the abdomen pelvis performed for bowel obstruction rule out.  Overall this is reassuring.  She does have known fatty liver disease or hepatic steatosis noted.  She was given IV fluids, Ativan  and Zofran , with improvement of her symptoms,  tolerated p.o.  We discussed alcohol withdrawal management and can try a course of Librium  as she is interested in quitting drinking.  Her husband is present at the bedside as well.  I also discussed a referral to gastroenterology as she has had persistent issues with nausea and vomiting now for some months, and may have some gastroparesis type problems, which may need further evaluation. [MT]    Clinical Course User Index [MT] Trifan, Donnice PARAS, MD                                 Medical Decision Making Amount and/or Complexity of Data Reviewed Labs: ordered. Radiology: ordered. ECG/medicine tests: ordered.  Risk Prescription drug management.   This note was produced using Electronics Engineer. While the provider has reviewed and verified all clinical information, transcription errors may remain.    Final diagnoses:  Dehydration  Nausea vomiting and diarrhea    ED Discharge Orders          Ordered    ondansetron  (ZOFRAN -ODT) 4 MG disintegrating tablet  Every 8 hours PRN        02/21/24 1819    chlordiazePOXIDE  (LIBRIUM ) 25 MG capsule        02/21/24 1842               Leslie Sullivan 02/21/24 1903    Cottie Donnice PARAS, MD 02/21/24 2358  "

## 2024-02-21 NOTE — Discharge Instructions (Addendum)
 It was a pleasure meeting with you today. It is reassuring you were able to tolerate food and fluids with zofran  today. I have sent zofran  to your pharmacy as well as Librium  taper to avoid alcohol withdrawals. Imaging today showed evidence of fatty liver disease. I have included information for a local GI office, I recommend you make an appointment with their office to discuss these findings as well as your recent symptoms. Please return to the ED if symptoms return or worsen, or any other new or concerning symptoms develop.

## 2024-02-21 NOTE — ED Triage Notes (Signed)
 Patient said she has been sick for 2 weeks with no appetite. Now has started vomiting. Feels weak and tired. Last time this happened she said she was admitted for sepsis. No burning urination. No pain anywhere.

## 2024-02-22 ENCOUNTER — Other Ambulatory Visit (HOSPITAL_COMMUNITY): Payer: Self-pay

## 2024-02-22 ENCOUNTER — Encounter: Payer: Self-pay | Admitting: Gastroenterology

## 2024-02-24 ENCOUNTER — Other Ambulatory Visit (HOSPITAL_COMMUNITY): Payer: Self-pay

## 2024-03-17 ENCOUNTER — Ambulatory Visit: Admitting: Gastroenterology

## 2024-03-29 ENCOUNTER — Ambulatory Visit: Admitting: Orthopedic Surgery
